# Patient Record
Sex: Female | Born: 1950 | Race: Black or African American | Hispanic: No | Marital: Single | Smoking: Never smoker
Health system: Southern US, Community
[De-identification: ages and names within clinical notes are randomized; demographics above are authoritative.]

---

## 2016-02-22 ENCOUNTER — Ambulatory Visit: Payer: Self-pay | Admitting: Podiatry

## 2016-03-26 ENCOUNTER — Ambulatory Visit (INDEPENDENT_AMBULATORY_CARE_PROVIDER_SITE_OTHER): Payer: BLUE CROSS/BLUE SHIELD | Admitting: Podiatry

## 2016-03-26 ENCOUNTER — Encounter: Payer: Self-pay | Admitting: Podiatry

## 2016-03-26 VITALS — BP 126/68 | HR 61 | Ht 61.5 in | Wt 158.0 lb

## 2016-03-26 DIAGNOSIS — M2042 Other hammer toe(s) (acquired), left foot: Secondary | ICD-10-CM

## 2016-03-26 DIAGNOSIS — M216X9 Other acquired deformities of unspecified foot: Secondary | ICD-10-CM

## 2016-03-26 DIAGNOSIS — M2040 Other hammer toe(s) (acquired), unspecified foot: Secondary | ICD-10-CM | POA: Diagnosis not present

## 2016-03-26 DIAGNOSIS — M21969 Unspecified acquired deformity of unspecified lower leg: Secondary | ICD-10-CM | POA: Diagnosis not present

## 2016-03-26 DIAGNOSIS — M7740 Metatarsalgia, unspecified foot: Secondary | ICD-10-CM

## 2016-03-26 DIAGNOSIS — M7741 Metatarsalgia, right foot: Secondary | ICD-10-CM

## 2016-03-26 DIAGNOSIS — M7742 Metatarsalgia, left foot: Principal | ICD-10-CM

## 2016-03-26 NOTE — Patient Instructions (Signed)
Seen for abnormal sensations on both feet, fore foot area. Noted of weak first metatarsal bone with lateral weight shifting.  Both feet casted for orthotics. OTC hard shell orthotics dispensed.

## 2016-03-26 NOTE — Progress Notes (Signed)
  No medical issues. SUBJECTIVE: 65 y.o. year old female presents stating toes tingle, bottom of feet sore at times, and toes cramp for duration of 6 months. Hx of foot surgery, bunion, hammer toe, plantar lesion on right foot. Exercise 10,000 steps a day, average 15-20k steps, 2 miles/day walk, vegitarian, possible menopause issue.  REVIEW OF SYSTEMS: A comprehensive review of systems was negative.  OBJECTIVE: DERMATOLOGIC EXAMINATION: Nails: normal findings. No abnormal skin lesions.   VASCULAR EXAMINATION OF LOWER LIMBS: Pedal pulses: All pedal pulses are palpable with normal pulsation.  Temperature gradient from tibial crest to dorsum of foot is within normal bilateral.  NEUROLOGIC EXAMINATION OF THE LOWER LIMBS: All epicritic and tactile sensations grossly intact.   MUSCULOSKELETAL EXAMINATION: Positive for excess sagittal plane motion of the first ray bilateral.  Pronating STJ with forefoot loading.   RADIOGRAPHIC FINDINGS: AP View:  Short first ray (-3-4) against the 2nd metatarsal bilateral. Reduced medial eminence of the first metatarsal head right, post surgical.  Lateral view:  Positive of elevated first ray bilateral, plantar calcaneal spur bilateral.   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 Lateral weight shifting bilateral. 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 Hyperpronation.  PLAN: Reviewed clinical findings and available treatment options. Both feet casted for orthotics. Hard shell OTC orthotics dispensed to use with dress shoes.

## 2016-05-14 ENCOUNTER — Ambulatory Visit: Payer: BLUE CROSS/BLUE SHIELD | Admitting: Podiatry

## 2016-05-15 ENCOUNTER — Encounter: Payer: Self-pay | Admitting: Podiatry

## 2016-05-15 ENCOUNTER — Ambulatory Visit (INDEPENDENT_AMBULATORY_CARE_PROVIDER_SITE_OTHER): Payer: BLUE CROSS/BLUE SHIELD | Admitting: Podiatry

## 2016-05-15 DIAGNOSIS — M7741 Metatarsalgia, right foot: Secondary | ICD-10-CM

## 2016-05-15 DIAGNOSIS — M7742 Metatarsalgia, left foot: Secondary | ICD-10-CM

## 2016-05-15 NOTE — Patient Instructions (Signed)
Orthotic follow up. Doing well with orthotics. Noted of excess callus on lateral aspect of forefoot right. Debrided keratotic lesion. Return as needed.

## 2016-05-15 NOTE — Progress Notes (Signed)
Been fine with Orthotics. Initially they made her leg sore but now they are fine.  Some numbness at medial aspect at bunion surgery site right. Pain on 5th metatarsal with callus build up on right.  Noted of eschar at lateral aspect of 5th metatarsal head right foot.  Positive Hx of foot surgery, bunion, hammer toe, plantar lesion on right foot.   Assessment: Satisfactory outcome with custom orthotics. Porokeratotic lesion with eschar 5th metatarsal head right lateral aspect.  Plan: Findings reviewed and keratotic lesion debrided. Return as needed.

## 2016-06-26 ENCOUNTER — Ambulatory Visit (INDEPENDENT_AMBULATORY_CARE_PROVIDER_SITE_OTHER): Payer: BLUE CROSS/BLUE SHIELD | Admitting: Podiatry

## 2016-06-26 ENCOUNTER — Encounter: Payer: Self-pay | Admitting: Podiatry

## 2016-06-26 VITALS — BP 119/65 | HR 75

## 2016-06-26 DIAGNOSIS — M79672 Pain in left foot: Secondary | ICD-10-CM | POA: Diagnosis not present

## 2016-06-26 DIAGNOSIS — Q828 Other specified congenital malformations of skin: Secondary | ICD-10-CM

## 2016-06-26 DIAGNOSIS — M7742 Metatarsalgia, left foot: Secondary | ICD-10-CM | POA: Diagnosis not present

## 2016-06-26 DIAGNOSIS — M7741 Metatarsalgia, right foot: Secondary | ICD-10-CM

## 2016-06-26 DIAGNOSIS — M21969 Unspecified acquired deformity of unspecified lower leg: Secondary | ICD-10-CM

## 2016-06-26 DIAGNOSIS — M79671 Pain in right foot: Secondary | ICD-10-CM | POA: Diagnosis not present

## 2016-06-26 NOTE — Progress Notes (Signed)
SUBJECTIVE: 65 y.o. year old female presents complaining of painful callus on side of right foot and pain under the first MPJ left foot. She is wearing custom orthotics that was made 2 month ago and doing well. She also has a pair of hard shell orthotics to use with dress shoes.  HPI: Hx of foot surgery, bunion, hammer toe, plantar lesion on right foot. Exercise 10,000 steps a day, average 15-20k steps, 2 miles/day walk, vegitarian, possible menopause issue.  OBJECTIVE: DERMATOLOGIC EXAMINATION: Keratotic eschar plantar lateral 5th MPJ area right foot with pain.   VASCULAR EXAMINATION OF LOWER LIMBS: All pedal pulses are palpable with normal pulsation.   NEUROLOGIC EXAMINATION OF THE LOWER LIMBS: All epicritic and tactile sensations grossly intact.   MUSCULOSKELETAL EXAMINATION: Positive for excess sagittal plane motion of the first ray bilateral.  Pronating STJ with forefoot loading.  Pain with weight bearing under the first MPJ left foot.  Previous X-ray result indicated:  Short first ray (-3-4) against the 2nd metatarsal bilateral. Reduced medial eminence of the first metatarsal head right, post surgical.  Lateral view:  Positive of elevated first ray bilateral, plantar calcaneal spur bilateral.   ASSESSMENT: Metatarsus primus elevatus bilateral with first MPJ pain left foot. Resolved Lateral weight shifting with Orthotic treatment bilateral. Painful keratosis and eschar 5th MPJ right foot.  PLAN: Reviewed clinical findings and available treatment options. Right foot lesion debrided. Discussed using foam pad and sample dispensed. Metatarsal binder dispensed to use during exercise to alleviate pain under the first MPJ left foot.

## 2016-06-26 NOTE — Patient Instructions (Addendum)
Seen for pain in left first MPJ plantar. May benefit from stabilizing the first ray. Painful callus under plantar lateral aspect right foot debrided. Discussed using foam pad to cushion. Metatarsal binder dispensed x 1 pr. Return as needed.

## 2018-02-02 ENCOUNTER — Ambulatory Visit (HOSPITAL_COMMUNITY)
Admission: EM | Admit: 2018-02-02 | Discharge: 2018-02-02 | Disposition: A | Discharge status: Home / Self Care | Payer: BLUE CROSS/BLUE SHIELD | Attending: Internal Medicine | Admitting: Internal Medicine

## 2018-02-02 ENCOUNTER — Ambulatory Visit (INDEPENDENT_AMBULATORY_CARE_PROVIDER_SITE_OTHER): Payer: BLUE CROSS/BLUE SHIELD

## 2018-02-02 ENCOUNTER — Encounter (HOSPITAL_COMMUNITY): Payer: Self-pay | Admitting: Emergency Medicine

## 2018-02-02 DIAGNOSIS — J181 Lobar pneumonia, unspecified organism: Secondary | ICD-10-CM

## 2018-02-02 DIAGNOSIS — R5381 Other malaise: Secondary | ICD-10-CM

## 2018-02-02 DIAGNOSIS — J189 Pneumonia, unspecified organism: Secondary | ICD-10-CM

## 2018-02-02 DIAGNOSIS — R042 Hemoptysis: Secondary | ICD-10-CM

## 2018-02-02 MED ORDER — ACETAMINOPHEN 325 MG PO TABS
650.0000 mg | ORAL_TABLET | Freq: Once | ORAL | Status: AC
  Administered 2018-02-02: 650 mg via ORAL

## 2018-02-02 MED ORDER — LEVOFLOXACIN 250 MG PO TABS: 250.0000 mg | ORAL_TABLET | Freq: Every day | ORAL | 0 refills | Status: DC

## 2018-02-02 MED ORDER — ACETAMINOPHEN 325 MG PO TABS
ORAL_TABLET | ORAL | Status: AC
Start: 2018-02-02 — End: ?
  Filled 2018-02-02: qty 2

## 2018-02-02 MED ORDER — BENZONATATE 100 MG PO CAPS: 100.0000 mg | ORAL_CAPSULE | Freq: Three times a day (TID) | ORAL | 0 refills | Status: DC | PRN

## 2018-02-02 MED ORDER — HYDROCODONE-HOMATROPINE 5-1.5 MG/5ML PO SYRP: 5.0000 mL | ORAL_SOLUTION | Freq: Every evening | ORAL | 0 refills | Status: DC | PRN

## 2018-02-02 MED ORDER — LEVOFLOXACIN 500 MG PO TABS: 500.0000 mg | ORAL_TABLET | Freq: Every day | ORAL | 0 refills | Status: DC

## 2018-02-02 NOTE — Discharge Instructions (Addendum)
Wallis BambergMario Brynleigh Sequeira, PA-C Primary Care at [facility] [phone] [address], [place], [place] [id number]  You may take 500mg  Tylenol every 6 hours for pain and inflammation. Make an appointment with me tomorrow at the Primary Care Clinic or come back to the urgent care clinic if you have not improved in 24-48 hours. If you are significantly worse including chest pain, shortness of breath, persistent fever, then report to the ER.  Use Hycodan as a cough syrup at bedtime to help suppress her cough.  During the day I recommend use Tessalon capsules.  Make sure you are resting and hydrating very well.

## 2018-02-02 NOTE — ED Provider Notes (Signed)
  MRN: [id number] DOB: [birth year]  Subjective:   Erin Holder is a 67 y.o. female presenting for 10 day history of productive cough, hemoptysis, runny nose and nasal congestion with bloody discharge, fever. Has also had nausea without vomiting, dizziness. Denies chest pain, shob, belly pain, confusion, difficulty breathing.  Has been using otc Delsym, APAP with minimal relief. Denies smoking cigarettes or drinking alcohol.   Denies using any chronic medications. Has No Known Allergies. Denies pmh. Has had 1 C-section in her 7520's. Father passed from lung cancer, was a heavy smoker. Denies personal history of lung disease, asthma, lung cancer, sarcoidosis.  Objective:   Vitals: BP (!) 108/57 (BP Location: Left Arm)   Pulse 96   Temp (!) 103.3 F (39.6 C) (Oral)   Resp 20   SpO2 96%   Physical Exam  Constitutional: She is oriented to person, place, and time. She appears well-developed and well-nourished.  HENT:  Mouth/Throat: Oropharynx is clear and moist.  Eyes: Right eye exhibits no discharge. Left eye exhibits no discharge. No scleral icterus.  Cardiovascular: Normal rate, regular rhythm, normal heart sounds and intact distal pulses. Exam reveals no gallop and no friction rub.  No murmur heard. Pulmonary/Chest: Effort normal. No stridor. No respiratory distress. She has no wheezes. She has rales (bilaterally over mid-lower lung fields).  Neurological: She is alert and oriented to person, place, and time.  Skin: Skin is warm and dry.  Psychiatric:  Appears lethargic.   Dg Chest 2 View  Result Date: 02/02/2018 CLINICAL DATA:  Cough and fevers for several days EXAM: CHEST - 2 VIEW COMPARISON:  None. FINDINGS: Cardiac shadow is within normal limits. The lungs are mildly hyperinflated. Patchy infiltrate is noted throughout the left upper lobe and superior segment of the left lower lobe consistent with multifocal acute pneumonia. Small pleural effusions are noted bilaterally. No bony  abnormality is seen. IMPRESSION: Multifocal left lung pneumonia. These results will be called to the ordering clinician or representative by the Radiologist Assistant, and communication documented in the PACS or zVision Dashboard. Electronically Signed   By: Alcide CleverMark  Lukens M.D.   On: 02/02/2018 20:40   Assessment and Plan :   Community acquired pneumonia of left lower lobe of lung (HCC)  Hemoptysis  Malaise  Patient's vitals are relatively stable apart from fever.  She does not have hypotension, tachycardia, tachypnea.  Will cover for community acquired pneumonia with Levaquin at 750 mg for 7 days.  Use cough suppression medications.  Scheduled Tylenol and ER and return to clinic precautions discussed.   Wallis BambergMani, Ithzel Fedorchak, [place]-C 02/02/18 2059

## 2018-02-02 NOTE — ED Triage Notes (Signed)
Pt here with fever and cough with blood tinged sputum; pt sts sick x 10 days

## 2018-02-03 ENCOUNTER — Ambulatory Visit (INDEPENDENT_AMBULATORY_CARE_PROVIDER_SITE_OTHER): Payer: BLUE CROSS/BLUE SHIELD | Admitting: Urgent Care

## 2018-02-03 ENCOUNTER — Encounter: Payer: Self-pay | Admitting: Urgent Care

## 2018-02-03 VITALS — BP 98/50 | HR 67 | Temp 97.6°F | Resp 16 | Ht 61.5 in | Wt 152.6 lb

## 2018-02-03 DIAGNOSIS — J9801 Acute bronchospasm: Secondary | ICD-10-CM

## 2018-02-03 DIAGNOSIS — J181 Lobar pneumonia, unspecified organism: Secondary | ICD-10-CM

## 2018-02-03 DIAGNOSIS — R05 Cough: Secondary | ICD-10-CM | POA: Diagnosis not present

## 2018-02-03 DIAGNOSIS — R042 Hemoptysis: Secondary | ICD-10-CM | POA: Diagnosis not present

## 2018-02-03 DIAGNOSIS — J189 Pneumonia, unspecified organism: Secondary | ICD-10-CM

## 2018-02-03 DIAGNOSIS — R059 Cough, unspecified: Secondary | ICD-10-CM

## 2018-02-03 LAB — POCT CBC
Granulocyte percent: 79.1 %G (ref 37–80)
HCT, POC: 31.7 % — AB (ref 37.7–47.9)
Hemoglobin: 10.6 g/dL — AB (ref 12.2–16.2)
LYMPH, POC: 1.3 (ref 0.6–3.4)
MCH: 28.7 pg (ref 27–31.2)
MCHC: 33.4 g/dL (ref 31.8–35.4)
MCV: 86.2 fL (ref 80–97)
MID (CBC): 0.6 (ref 0–0.9)
MPV: 8.1 fL (ref 0–99.8)
POC Granulocyte: 7.2 — AB (ref 2–6.9)
POC LYMPH %: 14.1 % (ref 10–50)
POC MID %: 6.8 % (ref 0–12)
Platelet Count, POC: 296 10*3/uL (ref 142–424)
RBC: 3.68 M/uL — AB (ref 4.04–5.48)
RDW, POC: 14.9 %
WBC: 9.1 10*3/uL (ref 4.6–10.2)

## 2018-02-03 MED ORDER — ALBUTEROL SULFATE HFA 108 (90 BASE) MCG/ACT IN AERS
2.0000 | INHALATION_SPRAY | Freq: Four times a day (QID) | RESPIRATORY_TRACT | 1 refills | Status: DC | PRN
Start: 2018-02-03 — End: 2018-02-24

## 2018-02-03 NOTE — Progress Notes (Signed)
   MRN: [id number] DOB: Jan 10, 1951  Subjective:   Erin Holder is a 67 y.o. female presenting for follow-up on community-acquired pneumonia.  Patient was seen yesterday at the [facility] urgent care for a 10-day history of productive cough with worsening symptoms including hemoptysis and fever.  She has taken 1 dose of Levaquin at 750 mg and was advised to follow-up here today.  Today, she reports that she is doing a little better.  Her appetite is coming back but she admits that she is eating very little and drank very little fluids over the past 2 days.  Denies chest pain, shortness of breath, heart racing.  Fevers are a little better as well.  She has used Hycodan and benzonatate as well.  Erin Holder has a current medication list which includes the following prescription(s): benzonatate, hydrocodone-homatropine, levofloxacin, and levofloxacin. Also has No Known Allergies.  Erin Holder denies past medical and surgical history.   Objective:   Vitals: BP (!) 92/57   Pulse 67   Temp 97.6 F (36.4 C) (Oral)   Resp 16   Ht 5' 1.5" (1.562 m)   Wt 152 lb 9.6 oz (69.2 kg)   SpO2 98%   BMI 28.37 kg/m   BP Readings from Last 3 Encounters:  02/03/18 (!) 92/57  02/02/18 101/63  06/26/16 119/65    Physical Exam  Constitutional: She is oriented to person, place, and time. She appears well-developed and well-nourished.  HENT:  Mouth/Throat: Oropharynx is clear and moist.  Cardiovascular: Normal rate, regular rhythm and intact distal pulses. Exam reveals no gallop and no friction rub.  No murmur heard. Pulmonary/Chest: No stridor. No respiratory distress. She has no wheezes. She has no rales.  Diminished lung sounds throughout.   Neurological: She is alert and oriented to person, place, and time.  Skin: Skin is warm and dry.  Psychiatric: She has a normal mood and affect.    Results for orders placed or performed in visit on 02/03/18 (from the past 24 hour(s))  POCT CBC     Status: Abnormal   Collection  Time: 02/03/18  4:25 PM  Result Value Ref Range   WBC 9.1 4.6 - 10.2 K/uL   Lymph, poc 1.3 0.6 - 3.4   POC LYMPH PERCENT 14.1 10 - 50 %L   MID (cbc) 0.6 0 - 0.9   POC MID % 6.8 0 - 12 %M   POC Granulocyte 7.2 (A) 2 - 6.9   Granulocyte percent 79.1 37 - 80 %G   RBC 3.68 (A) 4.04 - 5.48 M/uL   Hemoglobin 10.6 (A) 12.2 - 16.2 g/dL   HCT, POC 40.931.7 (A) 81.137.7 - 47.9 %   MCV 86.2 80 - 97 fL   MCH, POC 28.7 27 - 31.2 pg   MCHC 33.4 31.8 - 35.4 g/dL   RDW, POC 91.414.9 %   Platelet Count, POC 296 142 - 424 K/uL   MPV 8.1 0 - 99.8 fL   Assessment and Plan :   Community acquired pneumonia of left lower lobe of lung (HCC) - Plan: POCT CBC, Comprehensive metabolic panel  Hemoptysis  Cough  Patient is not agreeable to report to the ER. I counseled on potential risks of pneumonia. She would like to try several more days of Levaquin. Use albuterol inhaler every 6 hours for bronchospasms. ER precautions reviewed otherwise will follow up on 02/06/2018.  Wallis BambergMario Lulia Schriner, PA-C Primary Care at [facility] [phone] 02/03/2018  4:06 PM

## 2018-02-03 NOTE — Patient Instructions (Addendum)
If you develop chest pain, shortness of breath, belly pain or fevers that will not controlled with Tylenol and ibuprofen and you need to report to the ER immediately.  Otherwise I will see you back on Friday.    Community-Acquired Pneumonia, Adult Pneumonia is an infection of the lungs. There are different types of pneumonia. One type can develop while a person is in a hospital. A different type, called community-acquired pneumonia, develops in people who are not, or have not recently been, in the hospital or other health care facility. What are the causes? Pneumonia may be caused by bacteria, viruses, or funguses. Community-acquired pneumonia is often caused by Streptococcus pneumonia bacteria. These bacteria are often passed from one person to another by breathing in droplets from the cough or sneeze of an infected person. What increases the risk? The condition is more likely to develop in:  People who havechronic diseases, such as chronic obstructive pulmonary disease (COPD), asthma, congestive heart failure, cystic fibrosis, diabetes, or kidney disease.  People who haveearly-stage or late-stage HIV.  People who havesickle cell disease.  People who havehad their spleen removed (splenectomy).  People who havepoor Administratordental hygiene.  People who havemedical conditions that increase the risk of breathing in (aspirating) secretions their own mouth and nose.  People who havea weakened immune system (immunocompromised).  People who smoke.  People whotravel to areas where pneumonia-causing germs commonly exist.  People whoare around animal habitats or animals that have pneumonia-causing germs, including birds, bats, rabbits, cats, and farm animals.  What are the signs or symptoms? Symptoms of this condition include:  Adry cough.  A wet (productive) cough.  Fever.  Sweating.  Chest pain, especially when breathing deeply or coughing.  Rapid breathing or difficulty  breathing.  Shortness of breath.  Shaking chills.  Fatigue.  Muscle aches.  How is this diagnosed? Your health care provider will take a medical history and perform a physical exam. You may also have other tests, including:  Imaging studies of your chest, including X-rays.  Tests to check your blood oxygen level and other blood gases.  Other tests on blood, mucus (sputum), fluid around your lungs (pleural fluid), and urine.  If your pneumonia is severe, other tests may be done to identify the specific cause of your illness. How is this treated? The type of treatment that you receive depends on many factors, such as the cause of your pneumonia, the medicines you take, and other medical conditions that you have. For most adults, treatment and recovery from pneumonia may occur at home. In some cases, treatment must happen in a hospital. Treatment may include:  Antibiotic medicines, if the pneumonia was caused by bacteria.  Antiviral medicines, if the pneumonia was caused by a virus.  Medicines that are given by mouth or through an IV tube.  Oxygen.  Respiratory therapy.  Although rare, treating severe pneumonia may include:  Mechanical ventilation. This is done if you are not breathing well on your own and you cannot maintain a safe blood oxygen level.  Thoracentesis. This procedureremoves fluid around one lung or both lungs to help you breathe better.  Follow these instructions at home:  Take over-the-counter and prescription medicines only as told by your health care provider. ? Only takecough medicine if you are losing sleep. Understand that cough medicine can prevent your body's natural ability to remove mucus from your lungs. ? If you were prescribed an antibiotic medicine, take it as told by your health care provider. Do not stop  taking the antibiotic even if you start to feel better.  Sleep in a semi-upright position at night. Try sleeping in a reclining chair, or  place a few pillows under your head.  Do not use tobacco products, including cigarettes, chewing tobacco, and e-cigarettes. If you need help quitting, ask your health care provider.  Drink enough water to keep your urine clear or pale yellow. This will help to thin out mucus secretions in your lungs. How is this prevented? There are ways that you can decrease your risk of developing community-acquired pneumonia. Consider getting a pneumococcal vaccine if:  You are older than 65 years of age.  You are older than 65 years of age and are undergoing cancer treatment, have chronic lung disease, or have other medical conditions that affect your immune system. Ask your health care provider if this applies to you.  There are different types and schedules of pneumococcal vaccines. Ask your health care provider which vaccination option is best for you. You may also prevent community-acquired pneumonia if you take these actions:  Get an influenza vaccine every year. Ask your health care provider which type of influenza vaccine is best for you.  Go to the dentist on a regular basis.  Wash your hands often. Use hand sanitizer if soap and water are not available.  Contact a health care provider if:  You have a fever.  You are losing sleep because you cannot control your cough with cough medicine. Get help right away if:  You have worsening shortness of breath.  You have increased chest pain.  Your sickness becomes worse, especially if you are an older adult or have a weakened immune system.  You cough up blood. This information is not intended to replace advice given to you by your health care provider. Make sure you discuss any questions you have with your health care provider. Document Released: 08/05/2005 Document Revised: 12/14/2015 Document Reviewed: 11/30/2014 Elsevier Interactive Patient Education  2018 [employer].     IF you received an x-ray today, you will receive an invoice  from [facility]. Please contact [facility] at [phone] with questions or concerns regarding your invoice.   IF you received labwork today, you will receive an invoice from [facility]. Please contact [facility] at [phone] with questions or concerns regarding your invoice.   Our billing staff will not be able to assist you with questions regarding bills from these companies.  You will be contacted with the lab results as soon as they are available. The fastest way to get your results is to activate your My Chart account. Instructions are located on the last page of this paperwork. If you have not heard from [place] regarding the results in 2 weeks, please contact this office.

## 2018-02-04 LAB — COMPREHENSIVE METABOLIC PANEL
A/G RATIO: 1.1 — AB (ref 1.2–2.2)
ALK PHOS: 100 IU/L (ref 39–117)
ALT: 91 IU/L — AB (ref 0–32)
AST: 150 IU/L — AB (ref 0–40)
Albumin: 3 g/dL — ABNORMAL LOW (ref 3.6–4.8)
BUN/Creatinine Ratio: 13 (ref 12–28)
BUN: 12 mg/dL (ref 8–27)
Bilirubin Total: 0.3 mg/dL (ref 0.0–1.2)
CO2: 22 mmol/L (ref 20–29)
Calcium: 8.7 mg/dL (ref 8.7–10.3)
Chloride: 99 mmol/L (ref 96–106)
Creatinine, Ser: 0.91 mg/dL (ref 0.57–1.00)
GFR calc Af Amer: 77 mL/min/{1.73_m2} (ref >59)
GFR calc non Af Amer: 67 mL/min/{1.73_m2} (ref >59)
GLUCOSE: 106 mg/dL — AB (ref 65–99)
Globulin, Total: 2.8 g/dL (ref 1.5–4.5)
POTASSIUM: 4 mmol/L (ref 3.5–5.2)
Sodium: 137 mmol/L (ref 134–144)
Total Protein: 5.8 g/dL — ABNORMAL LOW (ref 6.0–8.5)

## 2018-02-06 ENCOUNTER — Ambulatory Visit (INDEPENDENT_AMBULATORY_CARE_PROVIDER_SITE_OTHER): Payer: BLUE CROSS/BLUE SHIELD | Admitting: Urgent Care

## 2018-02-06 ENCOUNTER — Encounter: Payer: Self-pay | Admitting: Urgent Care

## 2018-02-06 VITALS — BP 107/65 | HR 78 | Resp 18

## 2018-02-06 DIAGNOSIS — J189 Pneumonia, unspecified organism: Secondary | ICD-10-CM

## 2018-02-06 DIAGNOSIS — J181 Lobar pneumonia, unspecified organism: Secondary | ICD-10-CM

## 2018-02-06 DIAGNOSIS — R042 Hemoptysis: Secondary | ICD-10-CM

## 2018-02-06 DIAGNOSIS — J9801 Acute bronchospasm: Secondary | ICD-10-CM | POA: Diagnosis not present

## 2018-02-06 DIAGNOSIS — R05 Cough: Secondary | ICD-10-CM | POA: Diagnosis not present

## 2018-02-06 DIAGNOSIS — R059 Cough, unspecified: Secondary | ICD-10-CM

## 2018-02-06 NOTE — Progress Notes (Signed)
    MRN: [id number] DOB: [birth year]  Subjective:   Erin Holder is a 67 y.o. female presenting for follow up on CAP. Patient was last seen on 02/03/2018, was still not doing well. She was given IV fluids, prescribed albuterol inhaler, advised to maintain her cough and antibiotic medications. Today, she reports dramatic improvement. She is unable to tolerate 750mg  of Levaquin but is doing better with 500mg  Levaquin. She is using cough suppression medications but does not want to continue with albuterol. Denies fever, chest pain, n/v, abdominal pain.   Erin Holder has a current medication list which includes the following prescription(s): albuterol, benzonatate, hydrocodone-homatropine, levofloxacin, and levofloxacin. Also has No Known Allergies.  Erin Holder  has no past medical history on file. Also  has no past surgical history on file.  Objective:   Vitals: BP 107/65   Pulse 78   Resp 18   SpO2 96%   BP Readings from Last 3 Encounters:  02/06/18 107/65  02/03/18 (!) 98/50  02/02/18 101/63    Physical Exam  Constitutional: She is oriented to person, place, and time. She appears well-developed and well-nourished.  Cardiovascular: Normal rate, regular rhythm and intact distal pulses. Exam reveals no gallop and no friction rub.  No murmur heard. Pulmonary/Chest: No stridor. No respiratory distress. She has no wheezes. She has no rales.  Neurological: She is alert and oriented to person, place, and time.  Skin: Skin is warm and dry.  Psychiatric:  Appears lethargic.   Assessment and Plan :   Community acquired pneumonia of left lower lobe of lung (HCC)  Hemoptysis  Cough  Bronchospasm  Maintain 500mg  of Levaquin until she completes all medication. Will hold off on steroid course given dramatic improvement. Repeat chest x-ray in 3 weeks. Return-to-clinic precautions discussed, patient verbalized understanding.   Wallis BambergMario Norfleet Capers, PA-C [facility] [phone] 02/06/2018 5:28 PM

## 2018-02-06 NOTE — Patient Instructions (Signed)
     IF you received an x-ray today, you will receive an invoice from [facility]. Please contact [facility] at [phone] with questions or concerns regarding your invoice.   IF you received labwork today, you will receive an invoice from [facility]. Please contact [facility] at [phone] with questions or concerns regarding your invoice.   Our billing staff will not be able to assist you with questions regarding bills from these companies.  You will be contacted with the lab results as soon as they are available. The fastest way to get your results is to activate your My Chart account. Instructions are located on the last page of this paperwork. If you have not heard from us regarding the results in 2 weeks, please contact this office.     

## 2018-02-24 ENCOUNTER — Encounter: Payer: Self-pay | Admitting: Urgent Care

## 2018-02-24 ENCOUNTER — Ambulatory Visit (INDEPENDENT_AMBULATORY_CARE_PROVIDER_SITE_OTHER): Payer: BLUE CROSS/BLUE SHIELD

## 2018-02-24 ENCOUNTER — Ambulatory Visit (INDEPENDENT_AMBULATORY_CARE_PROVIDER_SITE_OTHER): Payer: BLUE CROSS/BLUE SHIELD | Admitting: Urgent Care

## 2018-02-24 VITALS — BP 102/64 | HR 72 | Temp 98.3°F | Resp 16 | Ht 61.5 in | Wt 153.8 lb

## 2018-02-24 DIAGNOSIS — R05 Cough: Secondary | ICD-10-CM

## 2018-02-24 DIAGNOSIS — R042 Hemoptysis: Secondary | ICD-10-CM

## 2018-02-24 DIAGNOSIS — J189 Pneumonia, unspecified organism: Secondary | ICD-10-CM

## 2018-02-24 DIAGNOSIS — J181 Lobar pneumonia, unspecified organism: Secondary | ICD-10-CM

## 2018-02-24 DIAGNOSIS — R059 Cough, unspecified: Secondary | ICD-10-CM

## 2018-02-24 DIAGNOSIS — J9801 Acute bronchospasm: Secondary | ICD-10-CM

## 2018-02-24 NOTE — Progress Notes (Signed)
    MRN: [id number] DOB: Jan 02, 1951  Subjective:   Erin Holder is a 67 y.o. female presenting for follow up on community-acquired pneumonia.  Last office visit was 02/06/2018.  Patient was considered for redirection toward ER which she was supposed to but agreed to increasing her dose of levofloxacin to 750 mg with supportive care.  Today, she reports that she is much better.  Denies fever, chest pain, shortness of breath, wheezing, hemoptysis.  She still has an occasional cough but feels like she is recovered.  Denies smoking cigarettes.  Erin Holder is not currently taking any medications and has no known food or drug allergies.  Denies past medical and surgical history.   Objective:   Vitals: BP 102/64   Pulse 72   Temp 98.3 F (36.8 C) (Oral)   Resp 16   Ht 5' 1.5" (1.562 m)   Wt 153 lb 12.8 oz (69.8 kg)   SpO2 98%   BMI 28.59 kg/m   Physical Exam  Constitutional: She is oriented to person, place, and time. She appears well-developed and well-nourished.  HENT:  Mouth/Throat: Oropharynx is clear and moist.  Cardiovascular: Normal rate, regular rhythm and intact distal pulses. Exam reveals no gallop and no friction rub.  No murmur heard. Pulmonary/Chest: Effort normal and breath sounds normal. No stridor. No respiratory distress. She has no wheezes. She has no rales.  Neurological: She is alert and oriented to person, place, and time.  Skin: Skin is warm and dry.  Psychiatric: She has a normal mood and affect.   Assessment and Plan :   Community acquired pneumonia of left lower lobe of lung (HCC) - Plan: DG Chest 2 View  Hemoptysis - Plan: DG Chest 2 View  Cough - Plan: DG Chest 2 View  Bronchospasm - Plan: DG Chest 2 View  Radiology report pending, patient is dramatically improved.  Return to clinic precautions discussed.  Will complete result note once radiology report is received.  Wallis BambergMario Felisia Balcom, PA-C [facility] [phone] 02/24/2018 10:39 AM

## 2018-02-24 NOTE — Patient Instructions (Addendum)
  Community-Acquired Pneumonia, Adult Pneumonia is an infection of the lungs. One type of pneumonia can happen while a person is in a hospital. A different type can happen when a person is not in a hospital (community-acquired pneumonia). It is easy for this kind to spread from person to person. It can spread to you if you breathe near an infected person who coughs or sneezes. Some symptoms include:  A dry cough.  A wet (productive) cough.  Fever.  Sweating.  Chest pain.  Follow these instructions at home:  Take over-the-counter and prescription medicines only as told by your doctor. ? Only take cough medicine if you are losing sleep. ? If you were prescribed an antibiotic medicine, take it as told by your doctor. Do not stop taking the antibiotic even if you start to feel better.  Sleep with your head and neck raised (elevated). You can do this by putting a few pillows under your head, or you can sleep in a recliner.  Do not use tobacco products. These include cigarettes, chewing tobacco, and e-cigarettes. If you need help quitting, ask your doctor.  Drink enough water to keep your pee (urine) clear or pale yellow. A shot (vaccine) can help prevent pneumonia. Shots are often suggested for:  People older than 65 years of age.  People older than 65 years of age: ? Who are having cancer treatment. ? Who have long-term (chronic) lung disease. ? Who have problems with their body's defense system (immune system).  You may also prevent pneumonia if you take these actions:  Get the flu (influenza) shot every year.  Go to the dentist as often as told.  Wash your hands often. If soap and water are not available, use hand sanitizer.  Contact a doctor if:  You have a fever.  You lose sleep because your cough medicine does not help. Get help right away if:  You are short of breath and it gets worse.  You have more chest pain.  Your sickness gets worse. This is very serious  if: ? You are an older adult. ? Your body's defense system is weak.  You cough up blood. This information is not intended to replace advice given to you by your health care provider. Make sure you discuss any questions you have with your health care provider. Document Released: 01/22/2008 Document Revised: 01/11/2016 Document Reviewed: 11/30/2014 Elsevier Interactive Patient Education  2018 Elsevier Inc.   IF you received an x-ray today, you will receive an invoice from [facility]. Please contact [facility] at [phone] with questions or concerns regarding your invoice.   IF you received labwork today, you will receive an invoice from [facility]. Please contact [facility] at [phone] with questions or concerns regarding your invoice.   Our billing staff will not be able to assist you with questions regarding bills from these companies.  You will be contacted with the lab results as soon as they are available. The fastest way to get your results is to activate your My Chart account. Instructions are located on the last page of this paperwork. If you have not heard from us regarding the results in 2 weeks, please contact this office.      

## 2018-03-02 ENCOUNTER — Other Ambulatory Visit: Payer: Self-pay

## 2018-03-02 ENCOUNTER — Encounter: Payer: Self-pay | Admitting: Family Medicine

## 2018-03-02 ENCOUNTER — Ambulatory Visit (INDEPENDENT_AMBULATORY_CARE_PROVIDER_SITE_OTHER): Payer: BLUE CROSS/BLUE SHIELD | Admitting: Family Medicine

## 2018-03-02 VITALS — BP 108/60 | HR 73 | Temp 98.3°F | Resp 17 | Ht 61.5 in | Wt 151.8 lb

## 2018-03-02 DIAGNOSIS — Z1211 Encounter for screening for malignant neoplasm of colon: Secondary | ICD-10-CM

## 2018-03-02 DIAGNOSIS — D508 Other iron deficiency anemias: Secondary | ICD-10-CM

## 2018-03-02 DIAGNOSIS — R232 Flushing: Secondary | ICD-10-CM

## 2018-03-02 DIAGNOSIS — Z1239 Encounter for other screening for malignant neoplasm of breast: Secondary | ICD-10-CM

## 2018-03-02 DIAGNOSIS — Z789 Other specified health status: Secondary | ICD-10-CM

## 2018-03-02 DIAGNOSIS — Z1231 Encounter for screening mammogram for malignant neoplasm of breast: Secondary | ICD-10-CM | POA: Diagnosis not present

## 2018-03-02 DIAGNOSIS — R7989 Other specified abnormal findings of blood chemistry: Secondary | ICD-10-CM

## 2018-03-02 MED ORDER — BLACK COHOSH MENOPAUSE COMPLEX PO TABS: 1.0000 | ORAL_TABLET | Freq: Every morning | ORAL | Status: AC

## 2018-03-02 MED ORDER — FLUOXETINE HCL 20 MG PO TABS: 20.0000 mg | ORAL_TABLET | Freq: Every day | ORAL | 3 refills | Status: DC

## 2018-03-02 NOTE — Progress Notes (Signed)
Chief Complaint  Patient presents with  . transition of care from [facility]    pt seen by [facility] on 01/1718 for community acquired pneumonia, pt wanted a female primary care physician    HPI   Hot flashes Pt reports that she has TAH/BSO for Uterine Fibroids She reports that she has severe hot flashes She feels very wet in the evenings She states that her symptoms start at 6pm and through the night She states that she has been taking over the counter estrogens She states that she tried evening primrose oil. Black cohosh does not work for her.   Colon Cancer Screening She has never had a colonoscopy she denies blood in her stool, unexpected weight loss or pain with defecation No rectal itching she does not smoke she does not have a family history of colon cancer  Mild Anemia She has a history of mild anemia She is vegetarian and takes a multivitamin She is not taking an iron supplement She has hot flashes  Lab Results  Component Value Date   WBC 4.7 03/02/2018   HGB 11.9 03/02/2018   HCT 36.5 03/02/2018   MCV 90 03/02/2018   PLT 201 03/02/2018     No past medical history on file.  Current Outpatient Medications  Medication Sig Dispense Refill  . Cetirizine HCl 10 MG CAPS Take 1 capsule (10 mg total) by mouth daily for 10 days. 10 capsule 0  . FLUoxetine (PROZAC) 20 MG tablet TAKE 1 TABLET BY MOUTH EVERY DAY 90 tablet 0  . fluticasone (FLONASE) 50 MCG/ACT nasal spray Place 1-2 sprays into both nostrils daily for 7 days. 1 g 0  . Misc Natural Products (BLACK COHOSH MENOPAUSE COMPLEX) TABS Take 1 tablet by mouth every morning.    . NON FORMULARY      No current facility-administered medications for this visit.     Allergies: No Known Allergies  Past Surgical History:  Procedure Laterality Date  . CESAREAN SECTION  06/1978    Social History   Socioeconomic History  . Marital status: Single    Spouse name: Not on file  . Number of children: Not on file  . Years of  education: Not on file  . Highest education level: Not on file  Occupational History  . Not on file  Social Needs  . Financial resource strain: Not on file  . Food insecurity:    Worry: Not on file    Inability: Not on file  . Transportation needs:    Medical: Not on file    Non-medical: Not on file  Tobacco Use  . Smoking status: Never Smoker  . Smokeless tobacco: Never Used  Substance and Sexual Activity  . Alcohol use: Never    Frequency: Never  . Drug use: Never  . Sexual activity: Not on file  Lifestyle  . Physical activity:    Days per week: Not on file    Minutes per session: Not on file  . Stress: Not on file  Relationships  . Social connections:    Talks on phone: Not on file    Gets together: Not on file    Attends religious service: Not on file    Active member of club or organization: Not on file    Attends meetings of clubs or organizations: Not on file    Relationship status: Not on file  Other Topics Concern  . Not on file  Social History Narrative  . Not on file    No  family history on file.   ROS Review of Systems See HPI Constitution: No fevers or chills No malaise No diaphoresis Skin: No rash or itching Eyes: no blurry vision, no double vision GU: no dysuria or hematuria Neuro: no dizziness or headaches  all others reviewed and negative   Objective: Vitals:   03/02/18 1521  BP: 108/60  Pulse: 73  Resp: 17  Temp: 98.3 F (36.8 C)  TempSrc: Oral  SpO2: 100%  Weight: 151 lb 12.8 oz (68.9 kg)  Height: 5' 1.5" (1.562 m)    Physical Exam  Constitutional: She is oriented to person, place, and time. She appears well-developed and well-nourished.  HENT:  Head: Normocephalic and atraumatic.  Eyes: Conjunctivae and EOM are normal.  Neck: Normal range of motion. Neck supple.  Cardiovascular: Normal rate, regular rhythm and normal heart sounds.  No murmur heard. Pulmonary/Chest: Effort normal and breath sounds normal. No stridor. No  respiratory distress. She has no wheezes.  Neurological: She is alert and oriented to person, place, and time.  Skin: Skin is warm. Capillary refill takes less than 2 seconds.  Psychiatric: She has a normal mood and affect. Her behavior is normal. Judgment and thought content normal.    Lab Results  Component Value Date   TSH 0.182 (L) 03/02/2018     Assessment and Plan [name] was seen today for [facility].  Diagnoses and all orders for this visit:  Hot flashes- like due to estrogen deficiency due to menopause -     TSH + free T4 -     Vitamin B12 -     CBC -     Iron, TIBC and Ferritin Panel -     TSH + free T4; Future  Screening for colon cancer- discussed colon cancer screening Pt elected cologuard -     Cologuard; Future -     Cologuard  Breast cancer screening -     Cancel: MM Digital Screening; Future -     MM 3D SCREEN BREAST BILATERAL; Future  Vegetarian diet- discussed dietary iron and b12 -     Vitamin B12 -     CBC -     Iron, TIBC and Ferritin Panel  Iron deficiency anemia secondary to inadequate dietary iron intake- discussed iron levels on previous labs Will check iron panel -     CBC -     Iron, TIBC and Ferritin Panel  Abnormal TSH- previous abnormal tsh without diagnosis of hyperthyroidism -     TSH + free T4; Future  Other orders -     Discontinue: FLUoxetine (PROZAC) 20 MG tablet; Take 1 tablet (20 mg total) by mouth daily. -     Misc Natural Products (BLACK COHOSH MENOPAUSE COMPLEX) TABS; Take 1 tablet by mouth every morning.     Kinslei Labine A Ameria Sanjurjo

## 2018-03-02 NOTE — Patient Instructions (Addendum)
From Diabetes.org . Last Reviewed: April 11, 2016  . Last Edited: June 03, 2016   Protein Choices Plant-Based Proteins Plant-based protein foods provide quality protein, healthy fats, and fiber. They vary in how much fat and carbohydrate they contain, so make sure to read labels. . Beans such as black, kidney, and pinto  . Bean products like baked beans and refried beans  . Hummus and falafel  . Lentils such as brown, green, or yellow  . Peas such as black-eyed or split peas  . Edamame  . Soy nuts  . Nuts and spreads like almond butter, cashew butter, or peanut butter  . Tempeh, tofu          IF you received an x-ray today, you will receive an invoice from [facility]. Please contact [facility] at [phone] with questions or concerns regarding your invoice.   IF you received labwork today, you will receive an invoice from [facility]. Please contact [facility] at [phone] with questions or concerns regarding your invoice.   Our billing staff will not be able to assist you with questions regarding bills from these companies.  You will be contacted with the lab results as soon as they are available. The fastest way to get your results is to activate your My Chart account. Instructions are located on the last page of this paperwork. If you have not heard from us regarding the results in 2 weeks, please contact this office.    We recommend that you schedule a mammogram for breast cancer screening. Typically, you do not need a referral to do this. Please contact a local imaging center to schedule your mammogram.  [facility] - [phone]  *ask for the Radiology Department [facility]) - [phone] or [phone]  [facility] - [phone] [facility] - [phone] [facility] - [phone]) [phone]  *ask for the Radiology Department [facility] - [phone]   *ask for the Radiology Department [facility] - [phone]  *ask for the Mammography Department [facility] - [phone]

## 2018-03-03 LAB — CBC
Hematocrit: 36.5 % (ref 34.0–46.6)
Hemoglobin: 11.9 g/dL (ref 11.1–15.9)
MCH: 29.4 pg (ref 26.6–33.0)
MCHC: 32.6 g/dL (ref 31.5–35.7)
MCV: 90 fL (ref 79–97)
PLATELETS: 201 10*3/uL (ref 150–450)
RBC: 4.05 x10E6/uL (ref 3.77–5.28)
RDW: 15.4 % (ref 12.3–15.4)
WBC: 4.7 10*3/uL (ref 3.4–10.8)

## 2018-03-03 LAB — TSH+FREE T4
FREE T4: 1.22 ng/dL (ref 0.82–1.77)
TSH: 0.182 u[IU]/mL — ABNORMAL LOW (ref 0.450–4.500)

## 2018-03-03 LAB — IRON,TIBC AND FERRITIN PANEL
Ferritin: 79 ng/mL (ref 15–150)
IRON SATURATION: 15 % (ref 15–55)
Iron: 48 ug/dL (ref 27–139)
TIBC: 324 ug/dL (ref 250–450)
UIBC: 276 ug/dL (ref 118–369)

## 2018-03-03 LAB — VITAMIN B12: Vitamin B-12: 1715 pg/mL — ABNORMAL HIGH (ref 232–1245)

## 2018-03-17 ENCOUNTER — Telehealth: Payer: Self-pay | Admitting: Family Medicine

## 2018-03-17 NOTE — Telephone Encounter (Signed)
Copied from CRM [phone]. Topic: Quick Communication - See Telephone Encounter >> Mar 17, 2018 11:12 AM Arlyss Gandyichardson, Christalyn Goertz N, NT wrote: CRM for notification. See Telephone encounter for: 03/17/18. Pt states that she has not received the cologuard test and she would like to know the status on receiving this.

## 2018-03-18 NOTE — Telephone Encounter (Signed)
Spoke with pt, she understands that she is now waiting for her cologuard package to come in the mail. She has no further questions at this time

## 2018-03-25 ENCOUNTER — Ambulatory Visit
Admission: RE | Admit: 2018-03-25 | Discharge: 2018-03-25 | Disposition: A | Discharge status: Home / Self Care | Payer: Medicare HMO | Source: Ambulatory Visit | Attending: Family Medicine | Admitting: Family Medicine

## 2018-03-25 DIAGNOSIS — Z1239 Encounter for other screening for malignant neoplasm of breast: Secondary | ICD-10-CM

## 2018-03-25 DIAGNOSIS — Z1231 Encounter for screening mammogram for malignant neoplasm of breast: Secondary | ICD-10-CM | POA: Diagnosis not present

## 2018-04-10 ENCOUNTER — Telehealth: Payer: Self-pay

## 2018-04-10 NOTE — Telephone Encounter (Signed)
Cologuard never received req for stool testing. Another form needs to be faxed.

## 2018-04-11 ENCOUNTER — Encounter (HOSPITAL_COMMUNITY): Payer: Self-pay | Admitting: Emergency Medicine

## 2018-04-11 ENCOUNTER — Ambulatory Visit (HOSPITAL_COMMUNITY)
Admission: EM | Admit: 2018-04-11 | Discharge: 2018-04-11 | Disposition: A | Discharge status: Home / Self Care | Payer: Medicare HMO | Attending: Internal Medicine | Admitting: Internal Medicine

## 2018-04-11 ENCOUNTER — Other Ambulatory Visit: Payer: Self-pay

## 2018-04-11 DIAGNOSIS — H9202 Otalgia, left ear: Secondary | ICD-10-CM | POA: Diagnosis not present

## 2018-04-11 MED ORDER — FLUTICASONE PROPIONATE 50 MCG/ACT NA SUSP: 1.0000 | Freq: Every day | NASAL | 0 refills | Status: AC

## 2018-04-11 MED ORDER — CETIRIZINE HCL 10 MG PO CAPS: 10.0000 mg | ORAL_CAPSULE | Freq: Every day | ORAL | 0 refills | Status: AC

## 2018-04-11 NOTE — ED Provider Notes (Signed)
[facility]    CSN: [id number] Arrival date & time: 04/11/18  1033     History   Chief Complaint Chief Complaint  Patient presents with  . Otalgia    HPI Erin Holder is a 67 y.o. female no significant past medical history presenting today for evaluation of left ear pain.  Patient has had left ear pain for approximately 1 week.  She denies any other associated symptoms including any hearing loss or muffled sensations in her ears.  States that she feels a deep penetrating pain in her "middle ear".  Denies associated congestion or cough.  Has had a slight sore throat today, but is related this to the drop in temperature.  Has been eating and drinking like normal.  She has been taking ibuprofen which has provided her with relief, but will return approximately 4 to 5 hours later.  Denies any associated dizziness or lightheadedness.  Pain will occasionally wake her up from sleep.  Denies fevers or night sweats.  Denies history of cerumen impaction.  HPI  History reviewed. No pertinent past medical history.  There are no active problems to display for this patient.   Past Surgical History:  Procedure Laterality Date  . CESAREAN SECTION  06/1978    OB History   None      Home Medications    Prior to Admission medications   Medication Sig Start Date End Date Taking? Authorizing Provider  NON FORMULARY    Yes [provider]  Cetirizine HCl 10 MG CAPS Take 1 capsule (10 mg total) by mouth daily for 10 days. 04/11/18 04/21/18  Elzina Devera C, PA-C  FLUoxetine (PROZAC) 20 MG tablet Take 1 tablet (20 mg total) by mouth daily. 03/02/18   Doristine Bosworth, MD  fluticasone (FLONASE) 50 MCG/ACT nasal spray Place 1-2 sprays into both nostrils daily for 7 days. 04/11/18 04/18/18  Dea Bitting C, PA-C  Misc Natural Products (BLACK COHOSH MENOPAUSE COMPLEX) TABS Take 1 tablet by mouth every morning. 03/02/18   Doristine Bosworth, MD    Family History History reviewed. No  pertinent family history.  Social History Social History   Tobacco Use  . Smoking status: Never Smoker  . Smokeless tobacco: Never Used  Substance Use Topics  . Alcohol use: Never    Frequency: Never  . Drug use: Never     Allergies   Patient has no known allergies.   Review of Systems Review of Systems  Constitutional: Negative for activity change, appetite change, chills, fatigue and fever.  HENT: Positive for ear pain and sore throat. Negative for congestion, rhinorrhea, sinus pressure and trouble swallowing.   Eyes: Negative for discharge and redness.  Respiratory: Negative for cough, chest tightness and shortness of breath.   Cardiovascular: Negative for chest pain.  Gastrointestinal: Negative for abdominal pain, diarrhea, nausea and vomiting.  Musculoskeletal: Negative for myalgias.  Skin: Negative for rash.  Neurological: Negative for dizziness, light-headedness and headaches.     Physical Exam Triage Vital Signs ED Triage Vitals  Enc Vitals Group     BP 04/11/18 1206 127/62     Pulse Rate 04/11/18 1206 68     Resp 04/11/18 1206 18     Temp 04/11/18 1206 97.9 F (36.6 C)     Temp Source 04/11/18 1206 Oral     SpO2 04/11/18 1206 100 %     Weight --      Height --      Head Circumference --  Peak Flow --      Pain Score 04/11/18 1203 9     Pain Loc --      Pain Edu? --      Excl. in GC? --    No data found.  Updated Vital Signs BP 127/62 (BP Location: Left Arm)   Pulse 68   Temp 97.9 F (36.6 C) (Oral)   Resp 18   SpO2 100%   Visual Acuity Right Eye Distance:   Left Eye Distance:   Bilateral Distance:    Right Eye Near:   Left Eye Near:    Bilateral Near:     Physical Exam  Constitutional: She appears well-developed and well-nourished. No distress.  HENT:  Head: Normocephalic and atraumatic.  Bilateral ears without tenderness to palpation of external auricle, tragus and mastoid, EAC's without erythema or swelling, TM's with good  bony landmarks and cone of light. Non erythematous.  Oral mucosa pink and moist, no tonsillar enlargement or exudate. Posterior pharynx patent and nonerythematous, no uvula deviation or swelling. Normal phonation.  Eyes: Conjunctivae are normal.  Neck: Neck supple.  Cardiovascular: Normal rate and regular rhythm.  No murmur heard. Pulmonary/Chest: Effort normal and breath sounds normal. No respiratory distress.  Breathing comfortably at rest, CTABL, no wheezing, rales or other adventitious sounds auscultated  Abdominal: Soft. There is no tenderness.  Musculoskeletal: She exhibits no edema.  Neurological: She is alert.  Skin: Skin is warm and dry.  Psychiatric: She has a normal mood and affect.  Nursing note and vitals reviewed.    UC Treatments / Results  Labs (all labs ordered are listed, but only abnormal results are displayed) Labs Reviewed - No data to display  EKG None  Radiology No results found.  Procedures Procedures (including critical care time)  Medications Ordered in UC Medications - No data to display  Initial Impression / Assessment and Plan / UC Course  I have reviewed the triage vital signs and the nursing notes.  Pertinent labs & imaging results that were available during my care of the patient were reviewed by me and considered in my medical decision making (see chart for details).     No source of pain identified on ear exam, possible eustachian tube dysfunction.  Will treat with nasal sprays/steroids, oral antihistamines.  Follow-up with ENT if pain persisting.  Return here if developing new symptoms or symptoms ear pain worsening.Discussed strict return precautions. Patient verbalized understanding and is agreeable with plan.  Final Clinical Impressions(s) / UC Diagnoses   Final diagnoses:  Otalgia of left ear     Discharge Instructions     No sign of ear infection causing her pain  Please continue to take ibuprofen and Tylenol to help with  discomfort, may use Tylenol in between taking ibuprofen  Please use Flonase nasal spray twice daily in each nostril, daily allergy pill like Zyrtec or Claritin  Please follow-up if pain changing, worsening, developing new symptoms with this, if pain persisting, please follow-up with ENT   ED Prescriptions    Medication Sig Dispense Auth. Provider   fluticasone (FLONASE) 50 MCG/ACT nasal spray Place 1-2 sprays into both nostrils daily for 7 days. 1 g Lavert Matousek C, PA-C   Cetirizine HCl 10 MG CAPS Take 1 capsule (10 mg total) by mouth daily for 10 days. 10 capsule Prarthana Parlin C, PA-C     Controlled Substance Prescriptions [place] Controlled Substance Registry consulted? Not Applicable   Lew DawesWieters, Treasure Ingrum C, [place]PA-C 04/11/18 1238

## 2018-04-11 NOTE — ED Triage Notes (Signed)
Left ear pain for one week.  Denies cough or runny nose.

## 2018-04-11 NOTE — Discharge Instructions (Signed)
No sign of ear infection causing her pain  Please continue to take ibuprofen and Tylenol to help with discomfort, may use Tylenol in between taking ibuprofen  Please use Flonase nasal spray twice daily in each nostril, daily allergy pill like Zyrtec or Claritin  Please follow-up if pain changing, worsening, developing new symptoms with this, if pain persisting, please follow-up with ENT

## 2018-04-13 ENCOUNTER — Ambulatory Visit: Payer: BLUE CROSS/BLUE SHIELD | Admitting: Family Medicine

## 2018-04-13 NOTE — Progress Notes (Deleted)
  No chief complaint on file.   HPI  4 review of systems  No past medical history on file.  Current Outpatient Medications  Medication Sig Dispense Refill  . Cetirizine HCl 10 MG CAPS Take 1 capsule (10 mg total) by mouth daily for 10 days. 10 capsule 0  . FLUoxetine (PROZAC) 20 MG tablet Take 1 tablet (20 mg total) by mouth daily. 30 tablet 3  . fluticasone (FLONASE) 50 MCG/ACT nasal spray Place 1-2 sprays into both nostrils daily for 7 days. 1 g 0  . Misc Natural Products (BLACK COHOSH MENOPAUSE COMPLEX) TABS Take 1 tablet by mouth every morning.    . NON FORMULARY      No current facility-administered medications for this visit.     Allergies: No Known Allergies  Past Surgical History:  Procedure Laterality Date  . CESAREAN SECTION  06/1978    Social History   Socioeconomic History  . Marital status: Single    Spouse name: Not on file  . Number of children: Not on file  . Years of education: Not on file  . Highest education level: Not on file  Occupational History  . Not on file  Social Needs  . Financial resource strain: Not on file  . Food insecurity:    Worry: Not on file    Inability: Not on file  . Transportation needs:    Medical: Not on file    Non-medical: Not on file  Tobacco Use  . Smoking status: Never Smoker  . Smokeless tobacco: Never Used  Substance and Sexual Activity  . Alcohol use: Never    Frequency: Never  . Drug use: Never  . Sexual activity: Not on file  Lifestyle  . Physical activity:    Days per week: Not on file    Minutes per session: Not on file  . Stress: Not on file  Relationships  . Social connections:    Talks on phone: Not on file    Gets together: Not on file    Attends religious service: Not on file    Active member of club or organization: Not on file    Attends meetings of clubs or organizations: Not on file    Relationship status: Not on file  Other Topics Concern  . Not on file  Social History Narrative  .  Not on file    No family history on file.   ROS Review of Systems See HPI Constitution: No fevers or chills No malaise No diaphoresis Skin: No rash or itching Eyes: no blurry vision, no double vision GU: no dysuria or hematuria Neuro: no dizziness or headaches * all others reviewed and negative   Objective: There were no vitals filed for this visit.  Physical Exam  Assessment and Plan There are no diagnoses linked to this encounter.   Erin Holder P [employer]addy

## 2018-04-22 ENCOUNTER — Other Ambulatory Visit: Payer: Self-pay | Admitting: Family Medicine

## 2018-04-23 NOTE — Telephone Encounter (Signed)
Reprinted order and sent via fax to cologuard at [phone].  Confirmation fax received at 5:40 pm Dgaddy, CMA

## 2018-05-05 DIAGNOSIS — Z1211 Encounter for screening for malignant neoplasm of colon: Secondary | ICD-10-CM | POA: Diagnosis not present

## 2018-05-13 LAB — COLOGUARD: Cologuard: NEGATIVE

## 2018-11-05 IMAGING — MG DIGITAL SCREENING BILATERAL MAMMOGRAM WITH TOMO AND CAD
4 series · 4 of 8 positions shown · non-contrast
Comparison: Previous exam(s).

CLINICAL DATA: Screening.

EXAM:
DIGITAL SCREENING BILATERAL MAMMOGRAM WITH TOMO AND CAD

[R CC synth-2D]
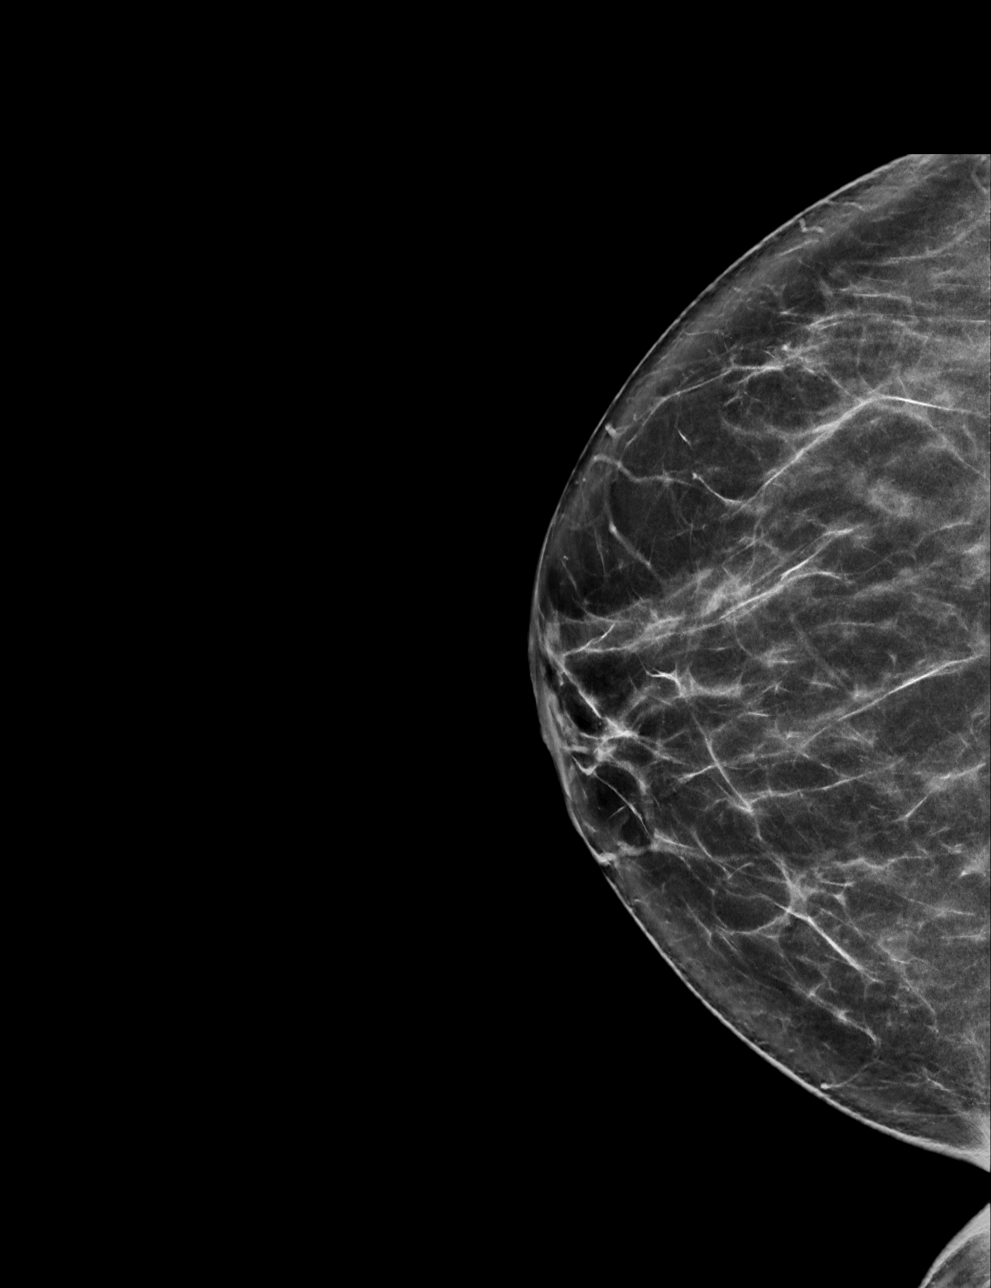

[L CC synth-2D]
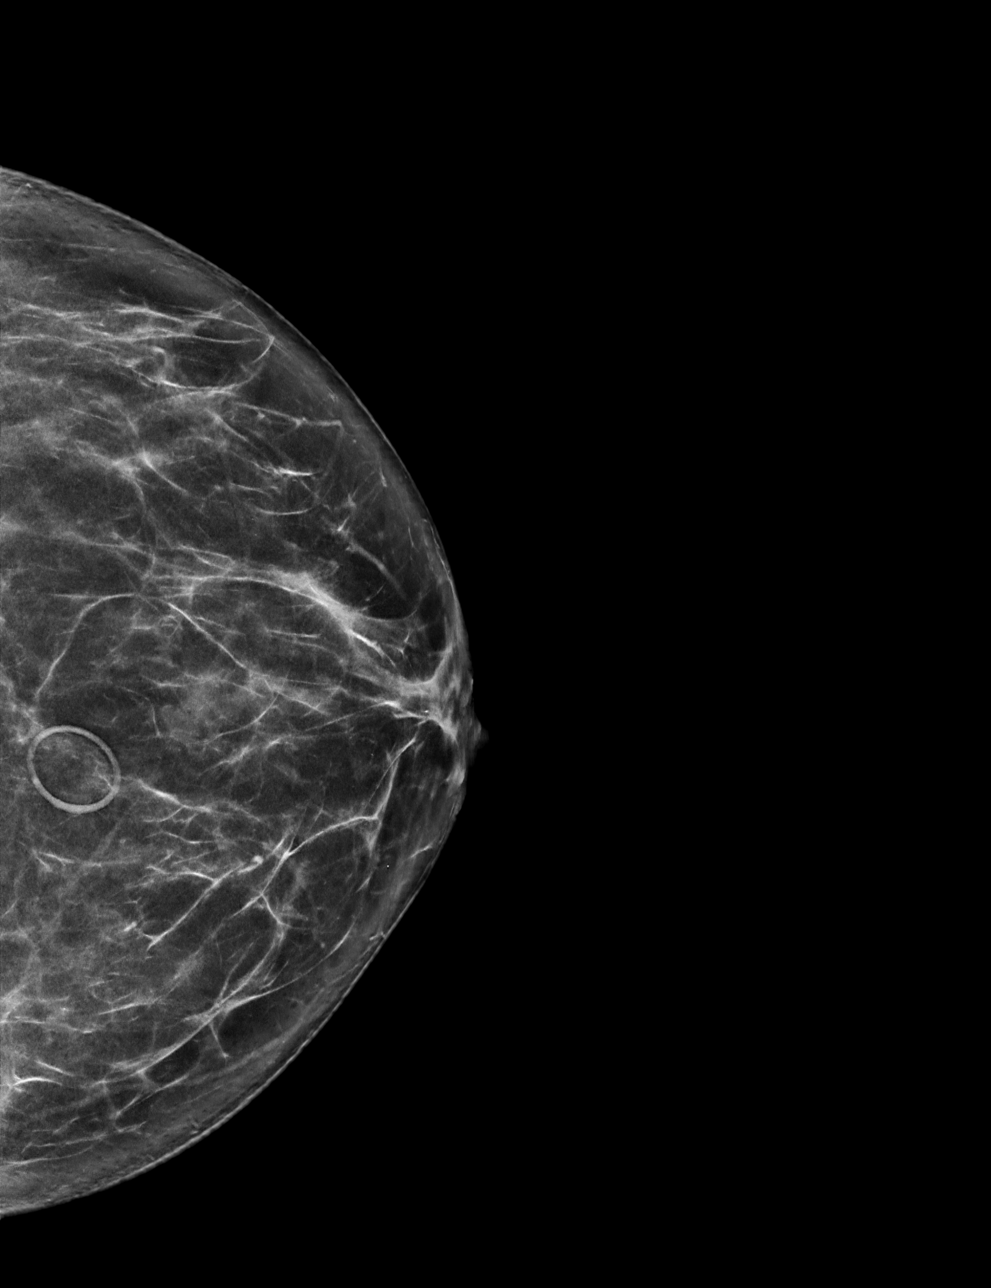

[L MLO synth-2D]
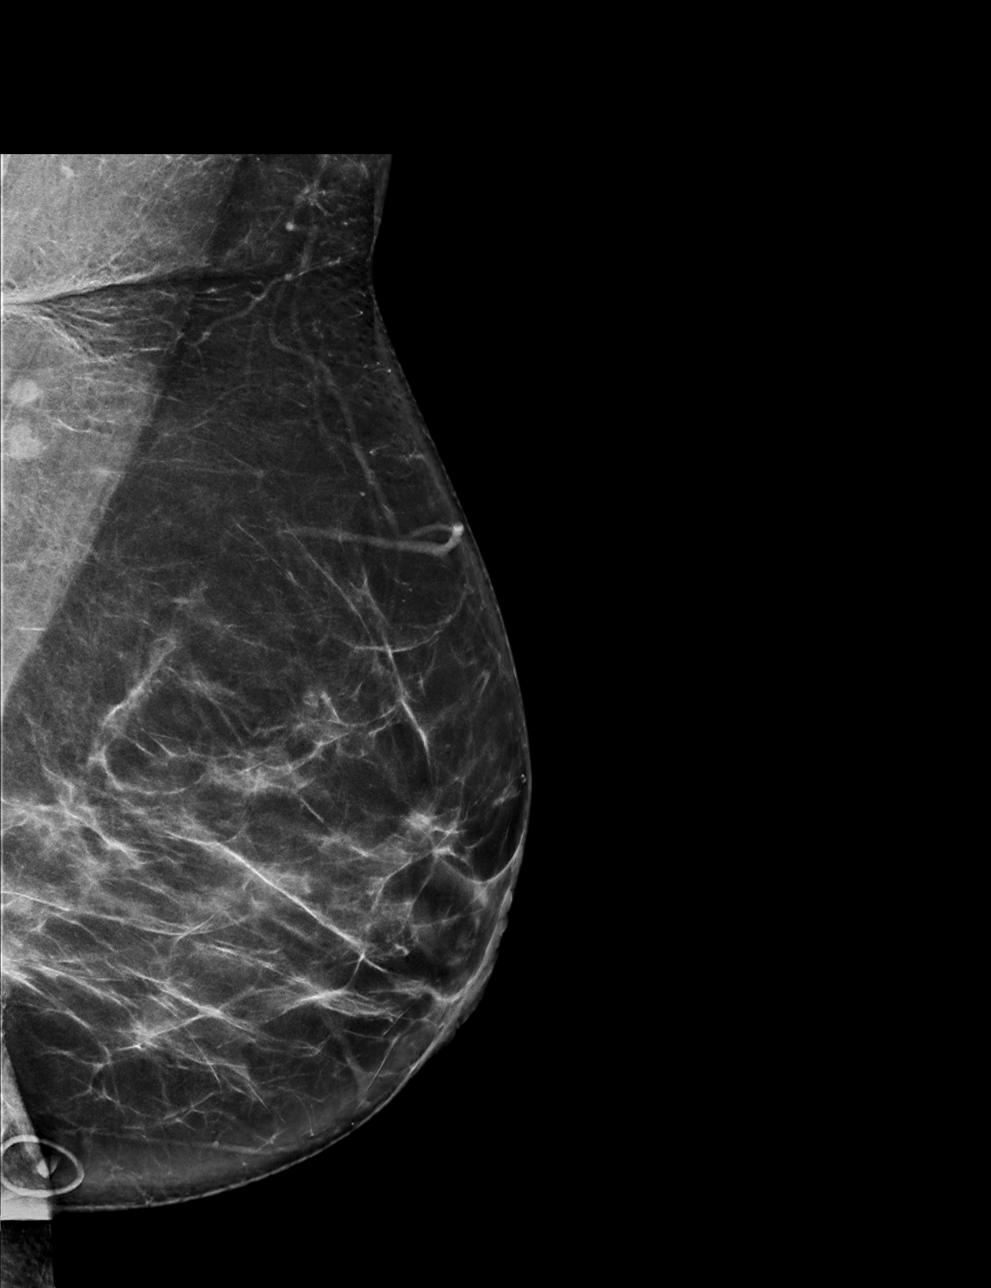

[L MLO tomo · tomo slice 39/77.0]
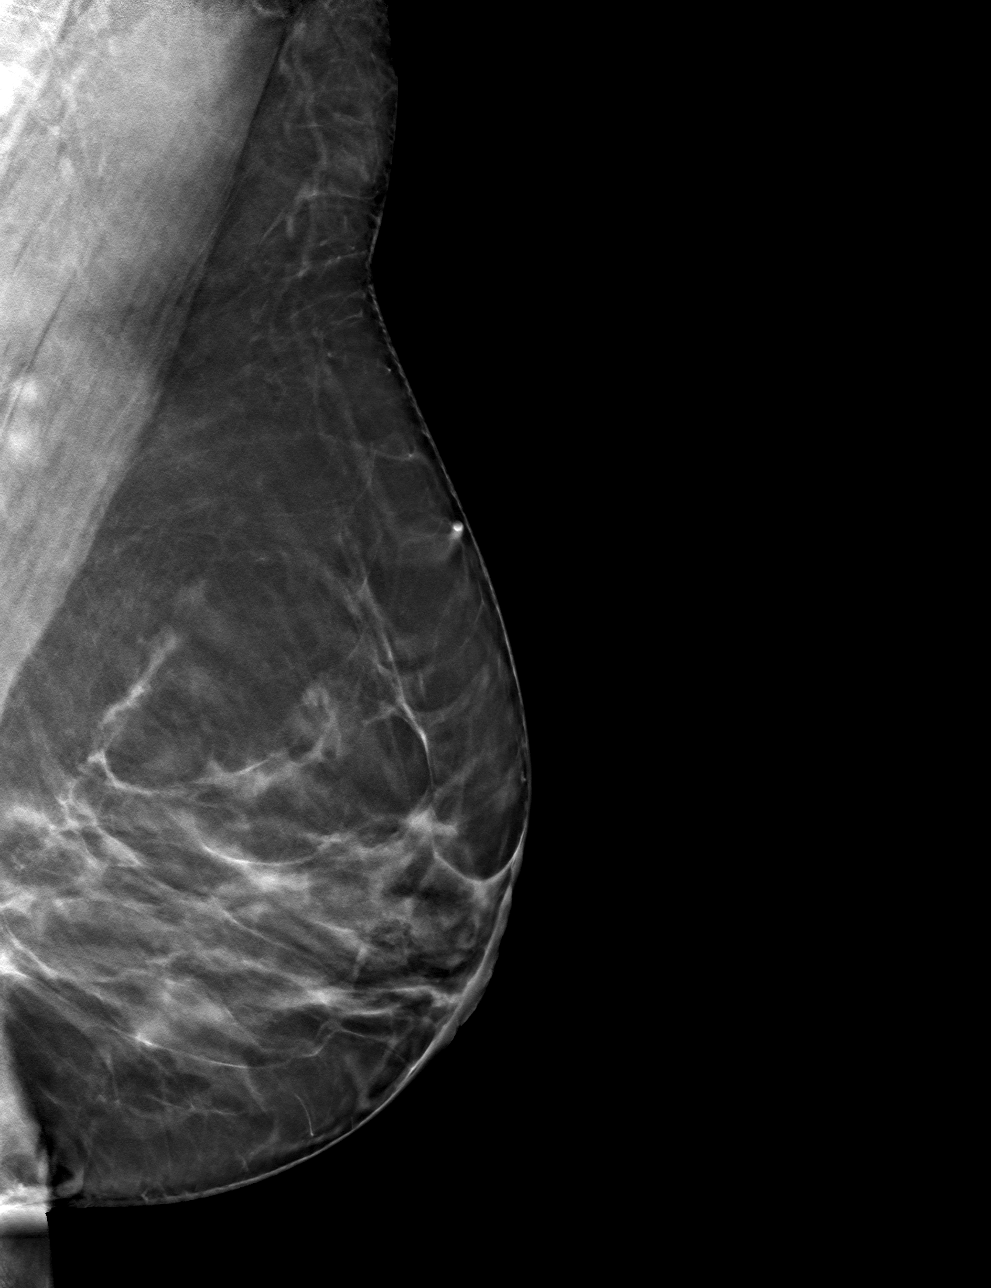

[4 of 8 positions shown; findings below may reference images not displayed]

ACR Breast Density Category b: There are scattered areas of
fibroglandular density.
FINDINGS: There are no findings suspicious for malignancy. Images were
processed with CAD.
IMPRESSION: No mammographic evidence of malignancy. A result letter of this
screening mammogram will be mailed directly to the patient.

RECOMMENDATION:
Screening mammogram in one year. (Code:[id number])

BI-RADS CATEGORY  1: Negative.

## 2019-06-24 DIAGNOSIS — R69 Illness, unspecified: Secondary | ICD-10-CM | POA: Diagnosis not present

## 2019-06-28 DIAGNOSIS — Z833 Family history of diabetes mellitus: Secondary | ICD-10-CM | POA: Diagnosis not present

## 2019-06-28 DIAGNOSIS — Z6830 Body mass index (BMI) 30.0-30.9, adult: Secondary | ICD-10-CM | POA: Diagnosis not present

## 2019-06-28 DIAGNOSIS — E669 Obesity, unspecified: Secondary | ICD-10-CM | POA: Diagnosis not present

## 2019-06-28 DIAGNOSIS — Z8249 Family history of ischemic heart disease and other diseases of the circulatory system: Secondary | ICD-10-CM | POA: Diagnosis not present

## 2019-11-04 DIAGNOSIS — M1711 Unilateral primary osteoarthritis, right knee: Secondary | ICD-10-CM | POA: Diagnosis not present

## 2019-11-04 DIAGNOSIS — M25562 Pain in left knee: Secondary | ICD-10-CM | POA: Diagnosis not present

## 2019-11-04 DIAGNOSIS — M25762 Osteophyte, left knee: Secondary | ICD-10-CM | POA: Diagnosis not present

## 2019-11-04 DIAGNOSIS — M1712 Unilateral primary osteoarthritis, left knee: Secondary | ICD-10-CM | POA: Diagnosis not present

## 2019-11-04 DIAGNOSIS — M25761 Osteophyte, right knee: Secondary | ICD-10-CM | POA: Diagnosis not present

## 2019-11-04 DIAGNOSIS — M222X1 Patellofemoral disorders, right knee: Secondary | ICD-10-CM | POA: Diagnosis not present

## 2019-11-04 DIAGNOSIS — M25561 Pain in right knee: Secondary | ICD-10-CM | POA: Diagnosis not present

## 2019-11-04 DIAGNOSIS — M170 Bilateral primary osteoarthritis of knee: Secondary | ICD-10-CM | POA: Diagnosis not present

## 2019-11-04 DIAGNOSIS — M222X2 Patellofemoral disorders, left knee: Secondary | ICD-10-CM | POA: Diagnosis not present

## 2019-11-05 ENCOUNTER — Ambulatory Visit: Payer: Medicare HMO | Attending: Internal Medicine

## 2019-11-05 DIAGNOSIS — Z23 Encounter for immunization: Secondary | ICD-10-CM

## 2019-11-05 NOTE — Progress Notes (Signed)
   Covid-19 Vaccination Clinic  Name:  Erin Holder    MRN: [id number] DOB: 1951-07-14  11/05/2019  [name] was observed post Covid-19 immunization for 15 minutes without incident. She was provided with Vaccine Information Sheet and instruction to access the V-Safe system.   [name] was instructed to call 911 with any severe reactions post vaccine: Marland Kitchen Difficulty breathing  . Swelling of face and throat  . A fast heartbeat  . A bad rash all over body  . Dizziness and weakness   Immunizations Administered    Name Date Dose VIS Date Route   Pfizer COVID-19 Vaccine 11/05/2019  3:35 PM 0.3 mL 07/30/2019 Intramuscular   Manufacturer: [employer], [employer]   Lot: [id number]   NDC: [address]-1

## 2019-11-10 DIAGNOSIS — M25762 Osteophyte, left knee: Secondary | ICD-10-CM | POA: Diagnosis not present

## 2019-11-10 DIAGNOSIS — M222X2 Patellofemoral disorders, left knee: Secondary | ICD-10-CM | POA: Diagnosis not present

## 2019-11-10 DIAGNOSIS — M1712 Unilateral primary osteoarthritis, left knee: Secondary | ICD-10-CM | POA: Diagnosis not present

## 2019-11-10 DIAGNOSIS — M25562 Pain in left knee: Secondary | ICD-10-CM | POA: Diagnosis not present

## 2019-11-10 DIAGNOSIS — R2689 Other abnormalities of gait and mobility: Secondary | ICD-10-CM | POA: Diagnosis not present

## 2019-11-11 DIAGNOSIS — M1711 Unilateral primary osteoarthritis, right knee: Secondary | ICD-10-CM | POA: Diagnosis not present

## 2019-11-11 DIAGNOSIS — M25761 Osteophyte, right knee: Secondary | ICD-10-CM | POA: Diagnosis not present

## 2019-11-11 DIAGNOSIS — M25561 Pain in right knee: Secondary | ICD-10-CM | POA: Diagnosis not present

## 2019-11-11 DIAGNOSIS — R2689 Other abnormalities of gait and mobility: Secondary | ICD-10-CM | POA: Diagnosis not present

## 2019-11-11 DIAGNOSIS — M222X1 Patellofemoral disorders, right knee: Secondary | ICD-10-CM | POA: Diagnosis not present

## 2019-11-17 DIAGNOSIS — M222X1 Patellofemoral disorders, right knee: Secondary | ICD-10-CM | POA: Diagnosis not present

## 2019-11-17 DIAGNOSIS — M25561 Pain in right knee: Secondary | ICD-10-CM | POA: Diagnosis not present

## 2019-11-17 DIAGNOSIS — M25761 Osteophyte, right knee: Secondary | ICD-10-CM | POA: Diagnosis not present

## 2019-11-17 DIAGNOSIS — R2689 Other abnormalities of gait and mobility: Secondary | ICD-10-CM | POA: Diagnosis not present

## 2019-11-17 DIAGNOSIS — M1711 Unilateral primary osteoarthritis, right knee: Secondary | ICD-10-CM | POA: Diagnosis not present

## 2019-11-18 DIAGNOSIS — M25762 Osteophyte, left knee: Secondary | ICD-10-CM | POA: Diagnosis not present

## 2019-11-18 DIAGNOSIS — R69 Illness, unspecified: Secondary | ICD-10-CM | POA: Diagnosis not present

## 2019-11-18 DIAGNOSIS — M25562 Pain in left knee: Secondary | ICD-10-CM | POA: Diagnosis not present

## 2019-11-18 DIAGNOSIS — M222X2 Patellofemoral disorders, left knee: Secondary | ICD-10-CM | POA: Diagnosis not present

## 2019-11-18 DIAGNOSIS — M1712 Unilateral primary osteoarthritis, left knee: Secondary | ICD-10-CM | POA: Diagnosis not present

## 2019-11-24 ENCOUNTER — Other Ambulatory Visit: Payer: Self-pay | Admitting: Family Medicine

## 2019-11-24 DIAGNOSIS — M25561 Pain in right knee: Secondary | ICD-10-CM | POA: Diagnosis not present

## 2019-11-24 DIAGNOSIS — M222X1 Patellofemoral disorders, right knee: Secondary | ICD-10-CM | POA: Diagnosis not present

## 2019-11-24 DIAGNOSIS — R2689 Other abnormalities of gait and mobility: Secondary | ICD-10-CM | POA: Diagnosis not present

## 2019-11-24 DIAGNOSIS — M1711 Unilateral primary osteoarthritis, right knee: Secondary | ICD-10-CM | POA: Diagnosis not present

## 2019-11-24 DIAGNOSIS — M25761 Osteophyte, right knee: Secondary | ICD-10-CM | POA: Diagnosis not present

## 2019-11-24 DIAGNOSIS — Z1231 Encounter for screening mammogram for malignant neoplasm of breast: Secondary | ICD-10-CM

## 2019-11-25 DIAGNOSIS — M1712 Unilateral primary osteoarthritis, left knee: Secondary | ICD-10-CM | POA: Diagnosis not present

## 2019-11-25 DIAGNOSIS — M25762 Osteophyte, left knee: Secondary | ICD-10-CM | POA: Diagnosis not present

## 2019-11-25 DIAGNOSIS — M222X2 Patellofemoral disorders, left knee: Secondary | ICD-10-CM | POA: Diagnosis not present

## 2019-11-25 DIAGNOSIS — R2689 Other abnormalities of gait and mobility: Secondary | ICD-10-CM | POA: Diagnosis not present

## 2019-11-25 DIAGNOSIS — M25562 Pain in left knee: Secondary | ICD-10-CM | POA: Diagnosis not present

## 2019-11-29 ENCOUNTER — Other Ambulatory Visit: Payer: Self-pay

## 2019-11-29 ENCOUNTER — Ambulatory Visit
Admission: RE | Admit: 2019-11-29 | Discharge: 2019-11-29 | Disposition: A | Discharge status: Home / Self Care | Payer: Medicare HMO | Source: Ambulatory Visit | Attending: Family Medicine | Admitting: Family Medicine

## 2019-11-29 DIAGNOSIS — Z1231 Encounter for screening mammogram for malignant neoplasm of breast: Secondary | ICD-10-CM | POA: Diagnosis not present

## 2019-12-01 ENCOUNTER — Ambulatory Visit: Payer: Medicare HMO | Attending: Internal Medicine

## 2019-12-01 DIAGNOSIS — Z23 Encounter for immunization: Secondary | ICD-10-CM

## 2019-12-01 DIAGNOSIS — M25561 Pain in right knee: Secondary | ICD-10-CM | POA: Diagnosis not present

## 2019-12-01 DIAGNOSIS — M1711 Unilateral primary osteoarthritis, right knee: Secondary | ICD-10-CM | POA: Diagnosis not present

## 2019-12-01 DIAGNOSIS — M222X1 Patellofemoral disorders, right knee: Secondary | ICD-10-CM | POA: Diagnosis not present

## 2019-12-01 DIAGNOSIS — M25761 Osteophyte, right knee: Secondary | ICD-10-CM | POA: Diagnosis not present

## 2019-12-01 DIAGNOSIS — R2689 Other abnormalities of gait and mobility: Secondary | ICD-10-CM | POA: Diagnosis not present

## 2019-12-01 NOTE — Progress Notes (Signed)
   Covid-19 Vaccination Clinic  Name:  Dezaria Methot    MRN: [id number] DOB: Apr 29, 1951  12/01/2019  Ms. Onken was observed post Covid-19 immunization for 15 minutes without incident. She was provided with Vaccine Information Sheet and instruction to access the V-Safe system.   Ms. Blakeley was instructed to call 911 with any severe reactions post vaccine: Marland Kitchen Difficulty breathing  . Swelling of face and throat  . A fast heartbeat  . A bad rash all over body  . Dizziness and weakness   Immunizations Administered    Name Date Dose VIS Date Route   Pfizer COVID-19 Vaccine 12/01/2019  8:41 AM 0.3 mL 07/30/2019 Intramuscular   Manufacturer: [employer], [employer]   Lot: [id number]   NDC: [address]-5

## 2019-12-02 DIAGNOSIS — M1712 Unilateral primary osteoarthritis, left knee: Secondary | ICD-10-CM | POA: Diagnosis not present

## 2019-12-02 DIAGNOSIS — M222X2 Patellofemoral disorders, left knee: Secondary | ICD-10-CM | POA: Diagnosis not present

## 2019-12-02 DIAGNOSIS — M25562 Pain in left knee: Secondary | ICD-10-CM | POA: Diagnosis not present

## 2019-12-02 DIAGNOSIS — M25762 Osteophyte, left knee: Secondary | ICD-10-CM | POA: Diagnosis not present

## 2019-12-02 DIAGNOSIS — R2689 Other abnormalities of gait and mobility: Secondary | ICD-10-CM | POA: Diagnosis not present

## 2020-01-31 DIAGNOSIS — Z008 Encounter for other general examination: Secondary | ICD-10-CM | POA: Diagnosis not present

## 2020-03-14 ENCOUNTER — Other Ambulatory Visit: Payer: Self-pay

## 2020-03-14 ENCOUNTER — Ambulatory Visit (INDEPENDENT_AMBULATORY_CARE_PROVIDER_SITE_OTHER): Payer: Medicare HMO | Admitting: Registered Nurse

## 2020-03-14 ENCOUNTER — Encounter: Payer: Self-pay | Admitting: Registered Nurse

## 2020-03-14 ENCOUNTER — Ambulatory Visit (INDEPENDENT_AMBULATORY_CARE_PROVIDER_SITE_OTHER): Payer: Medicare HMO

## 2020-03-14 VITALS — BP 111/67 | HR 68 | Temp 97.3°F | Resp 15 | Ht 61.5 in | Wt 161.3 lb

## 2020-03-14 DIAGNOSIS — J189 Pneumonia, unspecified organism: Secondary | ICD-10-CM

## 2020-03-14 DIAGNOSIS — R0602 Shortness of breath: Secondary | ICD-10-CM

## 2020-03-14 MED ORDER — AZITHROMYCIN 250 MG PO TABS: ORAL_TABLET | ORAL | 0 refills | Status: AC

## 2020-03-14 MED ORDER — ALBUTEROL SULFATE HFA 108 (90 BASE) MCG/ACT IN AERS: 2.0000 | INHALATION_SPRAY | Freq: Four times a day (QID) | RESPIRATORY_TRACT | 0 refills | Status: AC | PRN

## 2020-03-14 NOTE — Patient Instructions (Signed)
° ° ° °  If you have lab work done today you will be contacted with your lab results within the next 2 weeks.  If you have not heard from us then please contact us. The fastest way to get your results is to register for My Chart. ° ° °IF you received an x-ray today, you will receive an invoice from [facility]. Please contact [facility] at [phone] with questions or concerns regarding your invoice.  ° °IF you received labwork today, you will receive an invoice from [facility]. Please contact [facility] at [phone] with questions or concerns regarding your invoice.  ° °Our billing staff will not be able to assist you with questions regarding bills from these companies. ° °You will be contacted with the lab results as soon as they are available. The fastest way to get your results is to activate your My Chart account. Instructions are located on the last page of this paperwork. If you have not heard from us regarding the results in 2 weeks, please contact this office. °  ° ° ° °

## 2020-03-20 ENCOUNTER — Ambulatory Visit: Payer: Medicare HMO | Admitting: Registered Nurse

## 2020-04-27 DIAGNOSIS — L281 Prurigo nodularis: Secondary | ICD-10-CM | POA: Diagnosis not present

## 2020-05-08 ENCOUNTER — Telehealth: Payer: Self-pay | Admitting: Registered Nurse

## 2020-05-08 NOTE — Telephone Encounter (Signed)
Called pt LVM to call us back per CRM Patient is wanting a callback in regards to rescheduling her cpe with a female providerPatient is wanting a callback in regards to rescheduling her cpe with a female provider

## 2020-05-10 ENCOUNTER — Ambulatory Visit (INDEPENDENT_AMBULATORY_CARE_PROVIDER_SITE_OTHER): Payer: Medicare HMO | Admitting: Registered Nurse

## 2020-05-10 ENCOUNTER — Encounter: Payer: Self-pay | Admitting: Registered Nurse

## 2020-05-10 ENCOUNTER — Other Ambulatory Visit: Payer: Self-pay

## 2020-05-10 VITALS — BP 120/70 | HR 74 | Temp 97.6°F | Resp 18 | Ht 61.5 in | Wt 163.4 lb

## 2020-05-10 DIAGNOSIS — Z8639 Personal history of other endocrine, nutritional and metabolic disease: Secondary | ICD-10-CM | POA: Diagnosis not present

## 2020-05-10 DIAGNOSIS — Z0001 Encounter for general adult medical examination with abnormal findings: Secondary | ICD-10-CM

## 2020-05-10 DIAGNOSIS — H547 Unspecified visual loss: Secondary | ICD-10-CM | POA: Diagnosis not present

## 2020-05-10 DIAGNOSIS — Z0000 Encounter for general adult medical examination without abnormal findings: Secondary | ICD-10-CM

## 2020-05-10 DIAGNOSIS — R0602 Shortness of breath: Secondary | ICD-10-CM

## 2020-05-10 NOTE — Progress Notes (Signed)
Erin Holder is a pleasant 69 y.o. female presenting today for Medicare Annual Wellness Visit.   Date of last exam: 2019  Interpreter used for this visit? no  Patient Care Team: Janeece Agee, NP as PCP - General (Adult Health Nurse Practitioner)   Other items to address today: follow up on pna, labs, decreased visual acuity  Other Screening: Last screening for diabetes:  No results found for: HGBA1C  Last lipid screening: No results found for: CHOL, HDL, LDLCALC, LDLDIRECT, TRIG, CHOLHDL   ADVANCE DIRECTIVES: Discussed: yes On File: yes Materials Provided: no  Immunization status:  Immunization History  Administered Date(s) Administered  . PFIZER SARS-COV-2 Vaccination 11/05/2019, 12/01/2019     There are no preventive care reminders to display for this patient.   Functional Status Survey: Is the patient deaf or have difficulty hearing?: No Does the patient have difficulty seeing, even when wearing glasses/contacts?: No Does the patient have difficulty concentrating, remembering, or making decisions?: No Does the patient have difficulty walking or climbing stairs?: No Does the patient have difficulty dressing or bathing?: No Does the patient have difficulty doing errands alone such as visiting a doctor's office or shopping?: No   6CIT Screen 05/10/2020  What Year? 0 points  What month? 0 points  What time? 0 points  Count back from 20 0 points  Months in reverse 0 points  Repeat phrase 0 points  Total Score 0        Office Visit from 05/10/2020 in Primary Care at [facility]  AUDIT-C Score 0       Home Environment: safe. No tripping hazards. Tolerates stairs well. No grab bar in shower.  No acute concerns   There are no problems to display for this patient.    No past medical history on file.   Past Surgical History:  Procedure Laterality Date  . CESAREAN SECTION  06/1978     No family history on file.   Social History   Socioeconomic  History  . Marital status: Single    Spouse name: Not on file  . Number of children: Not on file  . Years of education: Not on file  . Highest education level: Not on file  Occupational History  . Not on file  Tobacco Use  . Smoking status: Never Smoker  . Smokeless tobacco: Never Used  Substance and Sexual Activity  . Alcohol use: Never  . Drug use: Never  . Sexual activity: Not on file  Other Topics Concern  . Not on file  Social History Narrative  . Not on file   Social Determinants of Health   Financial Resource Strain:   . Difficulty of Paying Living Expenses: Not on file  Food Insecurity:   . Worried About Programme researcher, broadcasting/film/video in the Last Year: Not on file  . Ran Out of Food in the Last Year: Not on file  Transportation Needs:   . Lack of Transportation (Medical): Not on file  . Lack of Transportation (Non-Medical): Not on file  Physical Activity:   . Days of Exercise per Week: Not on file  . Minutes of Exercise per Session: Not on file  Stress:   . Feeling of Stress : Not on file  Social Connections:   . Frequency of Communication with Friends and Family: Not on file  . Frequency of Social Gatherings with Friends and Family: Not on file  . Attends Religious Services: Not on file  . Active Member of Clubs or  Organizations: Not on file  . Attends Banker Meetings: Not on file  . Marital Status: Not on file  Intimate Partner Violence:   . Fear of Current or Ex-Partner: Not on file  . Emotionally Abused: Not on file  . Physically Abused: Not on file  . Sexually Abused: Not on file     No Known Allergies   Prior to Admission medications   Medication Sig Start Date End Date Taking? Authorizing Provider  albuterol (VENTOLIN HFA) 108 (90 Base) MCG/ACT inhaler Inhale 2 puffs into the lungs every 6 (six) hours as needed for wheezing or shortness of breath. Patient not taking: Reported on 05/10/2020 03/14/20   Janeece Agee, NP  azithromycin  (ZITHROMAX) 250 MG tablet Take 2 tablets on first day. Then take 1 tablet daily. Finish entire supply. Patient not taking: Reported on 05/10/2020 03/14/20   Janeece Agee, NP  Cetirizine HCl 10 MG CAPS Take 1 capsule (10 mg total) by mouth daily for 10 days. 04/11/18 04/21/18  Wieters, Hallie C, PA-C  FLUoxetine (PROZAC) 20 MG tablet TAKE 1 TABLET BY MOUTH EVERY DAY Patient not taking: Reported on 03/14/2020 04/22/18   Doristine Bosworth, MD  fluticasone (FLONASE) 50 MCG/ACT nasal spray Place 1-2 sprays into both nostrils daily for 7 days. 04/11/18 04/18/18  Wieters, Hallie C, PA-C  Misc Natural Products (BLACK COHOSH MENOPAUSE COMPLEX) TABS Take 1 tablet by mouth every morning. Patient not taking: Reported on 03/14/2020 03/02/18   Doristine Bosworth, MD  [facility] FORMUL[facility]     [provider]     Depression screen [facility] 2/9 05/10/2020 03/14/2020 03/02/2018  Decreased Interest 0 0 3  Down, Depressed, Hopeless 0 0 0  PHQ - 2 Score 0 0 3  Altered sleeping - - 3  Tired, decreased energy - - 0  Change in appetite - - 0  Feeling bad or failure about yourself  - - 0  Trouble concentrating - - 3  Moving slowly or fidgety/restless - - 0  Suicidal thoughts - - 0  PHQ-9 Score - - 9  Difficult doing work/chores - - Not difficult at all     Fall Risk  05/10/2020 03/14/2020 03/02/2018  Falls in the past year? 0 0 No  Number falls in past yr: 0 0 -  Injury with Fall? 0 0 -  Follow up Falls evaluation completed Falls evaluation completed -      PHYSICAL EXAM: BP 120/70   Pulse 74   Temp 97.6 F (36.4 C) (Temporal)   Resp 18   Ht 5' 1.5" (1.562 m)   Wt 163 lb 6.4 oz (74.1 kg)   SpO2 99%   BMI 30.37 kg/m  These represent patient reported vital signs taken at home. As this was a visit taking place via video chat, vital signs were not taken in office.   Wt Readings from Last 3 Encounters:  05/10/20 163 lb 6.4 oz (74.1 kg)  03/14/20 161 lb 4.8 oz (73.2 kg)  03/02/18 151 lb 12.8 oz (68.9 kg)       Hearing Screening   125Hz  250Hz  500Hz  1000Hz  2000Hz  3000Hz  4000Hz  6000Hz  8000Hz   Right ear:           Left ear:             Visual Acuity Screening   Right eye Left eye Both eyes  Without correction: 20/50 20/50 20/50   With correction:         Physical Exam As this visit was  conducted by video chat, a physical exam did not take place.  Education/Counseling provided regarding diet and exercise, prevention of chronic diseases, smoking/tobacco cessation, if applicable, and reviewed "Covered Medicare Preventive Services."   ASSESSMENT/PLAN: 1. Medicare annual wellness visit, subsequent Screenings complete. Pt safe, cognitively intact. No acute concern  2. History of elevated glucose Will draw labs to follow up   3. Shortness of breath Follow up on PNA. Exam unremarkable. Pt much improved.  4. Decreased visual acuity Refer to ophth to reestablish with Dr. Clinton Quant.    Janeece Agee, NP [facility] Primary Care at [facility] [address] [place], [place] [address] [phone] - 0000

## 2020-05-10 NOTE — Patient Instructions (Signed)
° ° ° °  If you have lab work done today you will be contacted with your lab results within the next 2 weeks.  If you have not heard from us then please contact us. The fastest way to get your results is to register for My Chart. ° ° °IF you received an x-ray today, you will receive an invoice from [facility]. Please contact [facility] at [phone] with questions or concerns regarding your invoice.  ° °IF you received labwork today, you will receive an invoice from [facility]. Please contact [facility] at [phone] with questions or concerns regarding your invoice.  ° °Our billing staff will not be able to assist you with questions regarding bills from these companies. ° °You will be contacted with the lab results as soon as they are available. The fastest way to get your results is to activate your My Chart account. Instructions are located on the last page of this paperwork. If you have not heard from us regarding the results in 2 weeks, please contact this office. °  ° ° ° °

## 2020-05-11 DIAGNOSIS — R69 Illness, unspecified: Secondary | ICD-10-CM | POA: Diagnosis not present

## 2020-05-11 LAB — CBC WITH DIFFERENTIAL
Basophils Absolute: 0 10*3/uL (ref 0.0–0.2)
Basos: 1 %
EOS (ABSOLUTE): 0.1 10*3/uL (ref 0.0–0.4)
Eos: 2 %
Hematocrit: 38.3 % (ref 34.0–46.6)
Hemoglobin: 12.7 g/dL (ref 11.1–15.9)
Immature Grans (Abs): 0 10*3/uL (ref 0.0–0.1)
Immature Granulocytes: 0 %
Lymphocytes Absolute: 1.2 10*3/uL (ref 0.7–3.1)
Lymphs: 29 %
MCH: 29.5 pg (ref 26.6–33.0)
MCHC: 33.2 g/dL (ref 31.5–35.7)
MCV: 89 fL (ref 79–97)
Monocytes Absolute: 0.4 10*3/uL (ref 0.1–0.9)
Monocytes: 10 %
Neutrophils Absolute: 2.4 10*3/uL (ref 1.4–7.0)
Neutrophils: 58 %
RBC: 4.3 x10E6/uL (ref 3.77–5.28)
RDW: 12.8 % (ref 11.7–15.4)
WBC: 4 10*3/uL (ref 3.4–10.8)

## 2020-05-11 LAB — HEMOGLOBIN A1C
Est. average glucose Bld gHb Est-mCnc: 117 mg/dL
Hgb A1c MFr Bld: 5.7 % — ABNORMAL HIGH (ref 4.8–5.6)

## 2020-05-11 LAB — COMPREHENSIVE METABOLIC PANEL
ALT: 15 IU/L (ref 0–32)
AST: 19 IU/L (ref 0–40)
Albumin/Globulin Ratio: 1.8 (ref 1.2–2.2)
Albumin: 4.1 g/dL (ref 3.8–4.8)
Alkaline Phosphatase: 91 IU/L (ref 44–121)
BUN/Creatinine Ratio: 7 — ABNORMAL LOW (ref 12–28)
BUN: 6 mg/dL — ABNORMAL LOW (ref 8–27)
Bilirubin Total: 0.7 mg/dL (ref 0.0–1.2)
CO2: 24 mmol/L (ref 20–29)
Calcium: 9.4 mg/dL (ref 8.7–10.3)
Chloride: 104 mmol/L (ref 96–106)
Creatinine, Ser: 0.84 mg/dL (ref 0.57–1.00)
GFR calc Af Amer: 83 mL/min/{1.73_m2} (ref >59)
GFR calc non Af Amer: 72 mL/min/{1.73_m2} (ref >59)
Globulin, Total: 2.3 g/dL (ref 1.5–4.5)
Glucose: 87 mg/dL (ref 65–99)
Potassium: 4.6 mmol/L (ref 3.5–5.2)
Sodium: 141 mmol/L (ref 134–144)
Total Protein: 6.4 g/dL (ref 6.0–8.5)

## 2020-05-11 LAB — LIPID PANEL
Chol/HDL Ratio: 2.6 ratio (ref 0.0–4.4)
Cholesterol, Total: 177 mg/dL (ref 100–199)
HDL: 69 mg/dL (ref >39)
LDL Chol Calc (NIH): 92 mg/dL (ref 0–99)
Triglycerides: 85 mg/dL (ref 0–149)
VLDL Cholesterol Cal: 16 mg/dL (ref 5–40)

## 2020-05-11 LAB — TSH: TSH: 1.08 u[IU]/mL (ref 0.450–4.500)

## 2020-05-16 ENCOUNTER — Encounter: Payer: Self-pay | Admitting: Registered Nurse

## 2020-05-16 NOTE — Progress Notes (Signed)
Acute Office Visit  Subjective:    Patient ID: Erin Holder, female    DOB: 1951/07/13, 69 y.o.   MRN: [id number]  Chief Complaint  Patient presents with   Pneumonia    Patient states she had pneumonia in 2019 and since about 2-3 weeks she has been having some of the same feeling. Per patient states she has some pressure in her chest and some shortness ot breath and would like to get a xray to check her lungs     HPI Patient is in today for concern for breathing Feeling heaviness in chest. Mild shob. Mild cough. General malaise and fatigue.  Feels that this is distinctly reminiscent of her PNA in 2019.  Interested in getting a chest xray and discussing treatment options  COVID vaccinated No known exposure No GI or constitutional symptoms at this time- limited to repsiratory  No past medical history on file.  Past Surgical History:  Procedure Laterality Date   CESAREAN SECTION  06/1978    No family history on file.  Social History   Socioeconomic History   Marital status: Single    Spouse name: Not on file   Number of children: Not on file   Years of education: Not on file   Highest education level: Not on file  Occupational History   Not on file  Tobacco Use   Smoking status: Never Smoker   Smokeless tobacco: Never Used  Substance and Sexual Activity   Alcohol use: Never   Drug use: Never   Sexual activity: Not on file  Other Topics Concern   Not on file  Social History Narrative   Not on file   Social Determinants of Health   Financial Resource Strain:    Difficulty of Paying Living Expenses: Not on file  Food Insecurity:    Worried About Running Out of Food in the Last Year: Not on file   Ran Out of Food in the Last Year: Not on file  Transportation Needs:    Lack of Transportation (Medical): Not on file   Lack of Transportation (Non-Medical): Not on file  Physical Activity:    Days of Exercise per Week: Not on file   Minutes of  Exercise per Session: Not on file  Stress:    Feeling of Stress : Not on file  Social Connections:    Frequency of Communication with Friends and Family: Not on file   Frequency of Social Gatherings with Friends and Family: Not on file   Attends Religious Services: Not on file   Active Member of Clubs or Organizations: Not on file   Attends Banker Meetings: Not on file   Marital Status: Not on file  Intimate Partner Violence:    Fear of Current or Ex-Partner: Not on file   Emotionally Abused: Not on file   Physically Abused: Not on file   Sexually Abused: Not on file    Outpatient Medications Prior to Visit  Medication Sig Dispense Refill   Cetirizine HCl 10 MG CAPS Take 1 capsule (10 mg total) by mouth daily for 10 days. 10 capsule 0   FLUoxetine (PROZAC) 20 MG tablet TAKE 1 TABLET BY MOUTH EVERY DAY (Patient not taking: Reported on 03/14/2020) 90 tablet 0   fluticasone (FLONASE) 50 MCG/ACT nasal spray Place 1-2 sprays into both nostrils daily for 7 days. 1 g 0   Misc Natural Products (BLACK COHOSH MENOPAUSE COMPLEX) TABS Take 1 tablet by mouth every morning. (Patient not taking: Reported  on 03/14/2020)     NON FORMULARY  (Patient not taking: Reported on 03/14/2020)     No facility-administered medications prior to visit.    No Known Allergies  Review of Systems  Constitutional: Negative.   HENT: Negative.   Eyes: Negative.   Respiratory: Positive for cough, chest tightness and shortness of breath.   Cardiovascular: Negative.   Gastrointestinal: Negative.   Genitourinary: Negative.   Musculoskeletal: Negative.   Skin: Negative.   Neurological: Negative.   Psychiatric/Behavioral: Negative.        Objective:    Physical Exam Vitals and nursing note reviewed.  Constitutional:      General: She is not in acute distress.    Appearance: Normal appearance. She is not ill-appearing, toxic-appearing or diaphoretic.  Cardiovascular:     Rate and  Rhythm: Normal rate and regular rhythm.  Pulmonary:     Effort: Pulmonary effort is normal. No respiratory distress.     Breath sounds: No stridor. Wheezing (mild, throughout) present. No rhonchi or rales.     Comments: Breath sounds decreased at bases Chest:     Chest wall: No tenderness.  Skin:    General: Skin is warm and dry.     Capillary Refill: Capillary refill takes less than 2 seconds.     Findings: No erythema.  Neurological:     General: No focal deficit present.     Mental Status: She is alert and oriented to person, place, and time. Mental status is at baseline.  Psychiatric:        Mood and Affect: Mood normal.        Behavior: Behavior normal.        Thought Content: Thought content normal.        Judgment: Judgment normal.     BP 111/67    Pulse 68    Temp (!) 97.3 F (36.3 C) (Temporal)    Resp 15    Ht 5' 1.5" (1.562 m)    Wt 161 lb 4.8 oz (73.2 kg)    SpO2 98%    BMI 29.98 kg/m  Wt Readings from Last 3 Encounters:  05/10/20 163 lb 6.4 oz (74.1 kg)  03/14/20 161 lb 4.8 oz (73.2 kg)  03/02/18 151 lb 12.8 oz (68.9 kg)    There are no preventive care reminders to display for this patient.  There are no preventive care reminders to display for this patient.   Lab Results  Component Value Date   TSH 1.080 05/10/2020   Lab Results  Component Value Date   WBC 4.0 05/10/2020   HGB 12.7 05/10/2020   HCT 38.3 05/10/2020   MCV 89 05/10/2020   PLT 201 03/02/2018   Lab Results  Component Value Date   NA 141 05/10/2020   K 4.6 05/10/2020   CO2 24 05/10/2020   GLUCOSE 87 05/10/2020   BUN 6 (L) 05/10/2020   CREATININE 0.84 05/10/2020   BILITOT 0.7 05/10/2020   ALKPHOS 91 05/10/2020   AST 19 05/10/2020   ALT 15 05/10/2020   PROT 6.4 05/10/2020   ALBUMIN 4.1 05/10/2020   CALCIUM 9.4 05/10/2020   Lab Results  Component Value Date   CHOL 177 05/10/2020   Lab Results  Component Value Date   HDL 69 05/10/2020   Lab Results  Component Value Date    LDLCALC 92 05/10/2020   Lab Results  Component Value Date   TRIG 85 05/10/2020   Lab Results  Component Value Date   CHOLHDL  2.6 05/10/2020   Lab Results  Component Value Date   HGBA1C 5.7 (H) 05/10/2020       Assessment & Plan:   Problem List Items Addressed This Visit    None    Visit Diagnoses    Shortness of breath    -  Primary   Relevant Medications   azithromycin (ZITHROMAX) 250 MG tablet   albuterol (VENTOLIN HFA) 108 (90 Base) MCG/ACT inhaler   Other Relevant Orders   DG Chest 2 View (Completed)   Community acquired pneumonia of right lower lobe of lung       Relevant Medications   azithromycin (ZITHROMAX) 250 MG tablet   albuterol (VENTOLIN HFA) 108 (90 Base) MCG/ACT inhaler       Meds ordered this encounter  Medications   azithromycin (ZITHROMAX) 250 MG tablet    Sig: Take 2 tablets on first day. Then take 1 tablet daily. Finish entire supply.    Dispense:  6 tablet    Refill:  0    Order Specific Question:   Supervising Provider    Answer:   Myles Lipps [[id number]]   albuterol (VENTOLIN HFA) 108 (90 Base) MCG/ACT inhaler    Sig: Inhale 2 puffs into the lungs every 6 (six) hours as needed for wheezing or shortness of breath.    Dispense:  18 g    Refill:  0    Order Specific Question:   Supervising Provider    AnswerMyles Lipps [[id number]]   PLAN  cxr shows consolidation likely indicating PNA  Will give zpack, albuterol for wheezing  Discussed nonpharm and otc options  Return if worsening or not improving within 3-4 days  Patient encouraged to call clinic with any questions, comments, or concerns.  Janeece Agee, NP

## 2020-05-30 DIAGNOSIS — R69 Illness, unspecified: Secondary | ICD-10-CM | POA: Diagnosis not present

## 2020-06-23 ENCOUNTER — Telehealth: Payer: Self-pay | Admitting: Registered Nurse

## 2020-06-23 NOTE — Telephone Encounter (Signed)
Referral Followup °

## 2020-08-22 ENCOUNTER — Ambulatory Visit (INDEPENDENT_AMBULATORY_CARE_PROVIDER_SITE_OTHER): Payer: Medicare HMO | Admitting: Primary Care

## 2020-09-25 DIAGNOSIS — M1711 Unilateral primary osteoarthritis, right knee: Secondary | ICD-10-CM | POA: Diagnosis not present

## 2020-10-02 DIAGNOSIS — M1711 Unilateral primary osteoarthritis, right knee: Secondary | ICD-10-CM | POA: Diagnosis not present

## 2020-10-09 DIAGNOSIS — M1711 Unilateral primary osteoarthritis, right knee: Secondary | ICD-10-CM | POA: Diagnosis not present

## 2020-12-29 ENCOUNTER — Other Ambulatory Visit: Payer: Self-pay | Admitting: Registered Nurse

## 2020-12-29 DIAGNOSIS — Z1231 Encounter for screening mammogram for malignant neoplasm of breast: Secondary | ICD-10-CM

## 2021-01-03 ENCOUNTER — Other Ambulatory Visit: Payer: Self-pay | Admitting: Family Medicine

## 2021-01-03 DIAGNOSIS — Z1231 Encounter for screening mammogram for malignant neoplasm of breast: Secondary | ICD-10-CM

## 2021-01-06 ENCOUNTER — Other Ambulatory Visit: Payer: Self-pay

## 2021-01-06 ENCOUNTER — Ambulatory Visit
Admission: RE | Admit: 2021-01-06 | Discharge: 2021-01-06 | Disposition: A | Discharge status: Home / Self Care | Payer: Medicare HMO | Source: Ambulatory Visit

## 2021-01-06 DIAGNOSIS — Z1231 Encounter for screening mammogram for malignant neoplasm of breast: Secondary | ICD-10-CM

## 2021-01-09 ENCOUNTER — Ambulatory Visit: Payer: Medicare HMO

## 2021-05-16 ENCOUNTER — Encounter: Payer: Self-pay | Admitting: Registered Nurse

## 2022-08-22 ENCOUNTER — Other Ambulatory Visit: Payer: Self-pay | Admitting: Family Medicine

## 2022-08-22 DIAGNOSIS — Z1231 Encounter for screening mammogram for malignant neoplasm of breast: Secondary | ICD-10-CM

## 2022-08-30 ENCOUNTER — Ambulatory Visit
Admission: RE | Admit: 2022-08-30 | Discharge: 2022-08-30 | Disposition: A | Discharge status: Home / Self Care | Payer: Medicare Other | Source: Ambulatory Visit | Attending: Family Medicine | Admitting: Family Medicine

## 2022-08-30 DIAGNOSIS — Z1231 Encounter for screening mammogram for malignant neoplasm of breast: Secondary | ICD-10-CM

## 2022-10-14 ENCOUNTER — Ambulatory Visit: Payer: Medicare HMO

## 2022-11-13 ENCOUNTER — Other Ambulatory Visit: Payer: Self-pay | Admitting: Family Medicine

## 2022-11-13 DIAGNOSIS — E2839 Other primary ovarian failure: Secondary | ICD-10-CM

## 2023-05-06 ENCOUNTER — Other Ambulatory Visit: Payer: Medicare Other

## 2023-07-21 LAB — EXTERNAL GENERIC LAB PROCEDURE: COLOGUARD: NEGATIVE
